# Patient Record
Sex: Female | Born: 1935 | Race: White | Hispanic: No | Smoking: Former smoker
Health system: Southern US, Community
[De-identification: ages and names within clinical notes are randomized; demographics above are authoritative.]

## PROBLEM LIST (undated history)

## (undated) DIAGNOSIS — I2510 Atherosclerotic heart disease of native coronary artery without angina pectoris: Secondary | ICD-10-CM

## (undated) DIAGNOSIS — J449 Chronic obstructive pulmonary disease, unspecified: Secondary | ICD-10-CM

## (undated) DIAGNOSIS — I4510 Unspecified right bundle-branch block: Secondary | ICD-10-CM

## (undated) DIAGNOSIS — E782 Mixed hyperlipidemia: Secondary | ICD-10-CM

## (undated) DIAGNOSIS — I10 Essential (primary) hypertension: Secondary | ICD-10-CM

## (undated) DIAGNOSIS — I739 Peripheral vascular disease, unspecified: Secondary | ICD-10-CM

## (undated) DIAGNOSIS — E039 Hypothyroidism, unspecified: Secondary | ICD-10-CM

## (undated) DIAGNOSIS — Z951 Presence of aortocoronary bypass graft: Secondary | ICD-10-CM

## (undated) HISTORY — PX: CATARACT EXTRACTION: SUR2

## (undated) HISTORY — DX: Mixed hyperlipidemia: E78.2

## (undated) HISTORY — DX: Hypothyroidism, unspecified: E03.9

## (undated) HISTORY — DX: Unspecified right bundle-branch block: I45.10

## (undated) HISTORY — DX: Essential (primary) hypertension: I10

## (undated) HISTORY — DX: Presence of aortocoronary bypass graft: Z95.1

## (undated) HISTORY — PX: EYE SURGERY: SHX253

## (undated) HISTORY — DX: Atherosclerotic heart disease of native coronary artery without angina pectoris: I25.10

## (undated) HISTORY — DX: Peripheral vascular disease, unspecified: I73.9

---

## 2006-07-08 DIAGNOSIS — Z951 Presence of aortocoronary bypass graft: Secondary | ICD-10-CM

## 2006-07-08 HISTORY — DX: Presence of aortocoronary bypass graft: Z95.1

## 2006-09-05 ENCOUNTER — Ambulatory Visit: Payer: Self-pay | Admitting: Cardiology

## 2006-09-06 ENCOUNTER — Ambulatory Visit: Payer: Self-pay | Admitting: Cardiology

## 2006-09-06 ENCOUNTER — Inpatient Hospital Stay (HOSPITAL_COMMUNITY): Admission: EM | Admit: 2006-09-06 | Discharge: 2006-09-12 | Payer: Self-pay | Admitting: Pulmonary Disease

## 2006-09-06 HISTORY — PX: CORONARY ARTERY BYPASS GRAFT: SHX141

## 2006-09-08 ENCOUNTER — Ambulatory Visit: Payer: Self-pay | Admitting: Thoracic Surgery (Cardiothoracic Vascular Surgery)

## 2006-09-18 ENCOUNTER — Encounter: Payer: Self-pay | Admitting: Cardiology

## 2006-09-19 ENCOUNTER — Ambulatory Visit: Payer: Self-pay | Admitting: Cardiology

## 2006-09-30 ENCOUNTER — Ambulatory Visit: Payer: Self-pay | Admitting: Surgery

## 2006-10-09 ENCOUNTER — Ambulatory Visit: Payer: Self-pay | Admitting: Thoracic Surgery (Cardiothoracic Vascular Surgery)

## 2006-10-31 ENCOUNTER — Ambulatory Visit: Payer: Self-pay | Admitting: Thoracic Surgery (Cardiothoracic Vascular Surgery)

## 2006-10-31 ENCOUNTER — Encounter
Admission: RE | Admit: 2006-10-31 | Discharge: 2006-10-31 | Payer: Self-pay | Admitting: Thoracic Surgery (Cardiothoracic Vascular Surgery)

## 2007-01-20 ENCOUNTER — Ambulatory Visit: Payer: Self-pay | Admitting: Cardiology

## 2007-08-25 ENCOUNTER — Ambulatory Visit: Payer: Self-pay | Admitting: Cardiology

## 2007-08-26 ENCOUNTER — Encounter: Payer: Self-pay | Admitting: Physician Assistant

## 2007-09-10 ENCOUNTER — Encounter: Payer: Self-pay | Admitting: Cardiology

## 2007-11-09 ENCOUNTER — Ambulatory Visit: Payer: Self-pay | Admitting: Cardiology

## 2008-06-30 ENCOUNTER — Encounter: Payer: Self-pay | Admitting: Cardiology

## 2008-08-23 ENCOUNTER — Encounter: Payer: Self-pay | Admitting: Cardiology

## 2008-08-23 ENCOUNTER — Ambulatory Visit: Payer: Self-pay | Admitting: Cardiology

## 2008-08-23 DIAGNOSIS — J449 Chronic obstructive pulmonary disease, unspecified: Secondary | ICD-10-CM | POA: Insufficient documentation

## 2008-08-23 DIAGNOSIS — E039 Hypothyroidism, unspecified: Secondary | ICD-10-CM | POA: Insufficient documentation

## 2008-08-23 DIAGNOSIS — E785 Hyperlipidemia, unspecified: Secondary | ICD-10-CM | POA: Insufficient documentation

## 2008-08-23 DIAGNOSIS — I739 Peripheral vascular disease, unspecified: Secondary | ICD-10-CM | POA: Insufficient documentation

## 2008-08-23 DIAGNOSIS — I10 Essential (primary) hypertension: Secondary | ICD-10-CM | POA: Insufficient documentation

## 2008-08-23 DIAGNOSIS — I25810 Atherosclerosis of coronary artery bypass graft(s) without angina pectoris: Secondary | ICD-10-CM | POA: Insufficient documentation

## 2009-03-21 ENCOUNTER — Encounter (INDEPENDENT_AMBULATORY_CARE_PROVIDER_SITE_OTHER): Payer: Self-pay | Admitting: *Deleted

## 2009-03-22 ENCOUNTER — Ambulatory Visit: Payer: Self-pay | Admitting: Cardiology

## 2009-03-23 ENCOUNTER — Encounter: Payer: Self-pay | Admitting: Cardiology

## 2009-03-30 ENCOUNTER — Encounter: Payer: Self-pay | Admitting: Cardiology

## 2009-03-31 ENCOUNTER — Encounter: Payer: Self-pay | Admitting: Cardiology

## 2009-04-21 ENCOUNTER — Encounter: Payer: Self-pay | Admitting: Cardiology

## 2009-06-08 ENCOUNTER — Encounter: Payer: Self-pay | Admitting: Cardiology

## 2009-06-08 ENCOUNTER — Telehealth (INDEPENDENT_AMBULATORY_CARE_PROVIDER_SITE_OTHER): Payer: Self-pay | Admitting: *Deleted

## 2009-06-27 ENCOUNTER — Encounter: Payer: Self-pay | Admitting: Cardiology

## 2009-07-04 ENCOUNTER — Encounter (INDEPENDENT_AMBULATORY_CARE_PROVIDER_SITE_OTHER): Payer: Self-pay | Admitting: *Deleted

## 2010-06-29 ENCOUNTER — Ambulatory Visit: Payer: Self-pay | Admitting: Cardiology

## 2010-06-29 ENCOUNTER — Encounter: Payer: Self-pay | Admitting: Physician Assistant

## 2010-07-03 ENCOUNTER — Encounter: Payer: Self-pay | Admitting: Cardiology

## 2010-07-06 ENCOUNTER — Telehealth (INDEPENDENT_AMBULATORY_CARE_PROVIDER_SITE_OTHER): Payer: Self-pay | Admitting: *Deleted

## 2010-08-09 NOTE — Progress Notes (Signed)
Summary: Needing to discuss thyroid  Phone Note Call from Patient Call back at [facility] Phone [phone]   Caller: Patient Reason for Call: Talk to Nurse Summary of Call: Does not remember conversation about her thyroid.  Would like someone to call her and explain again and explain why she is not being seen sooner.  Initial call taken by: Claudette Laws,  July 06, 2010 12:27 PM  Follow-up for Phone Call        Discussed with patient.  Advised her that I faxed results to PMD on  12/29 at 4:07.  Patient states that nurse told her that they had not received anything from [place].  Patient stated that they scheduled her for OV in March.  Advised her that I would fax results again.  Patient verbalized understanding.  Follow-up by: Hoover Brunette, LPN,  July 06, 2010 2:21 PM

## 2010-08-09 NOTE — Assessment & Plan Note (Signed)
Summary: 1 YR FUL   Visit Type:  Follow-up Primary Provider:  [name], PA (with Dr. [name])   History of Present Illness: [name] presents for annual follow up.  She denies any interim development of symptoms suggestive of UAPat or CHF. However, she has been using an inhaler, for her dyspnea. She has history of COPD, with prior history of tobacco. Interestingly, she was seen in Dr. Adelene Idler office yesterday, and refused to have a recommended CXR.  Last lipid profile, 12/10: LDL 94, on low-dose simvastatin.  Preventive Screening-Counseling & Management  Alcohol-Tobacco     Smoking Status: quit     Year Quit: 2008  Current Medications (verified): 1)  Levothyroxine Sodium 112 Mcg Tabs (Levothyroxine Sodium) .... Once Daily 2)  Aspirin 81 Mg Tbec (Aspirin) .... Take One Tablet By Mouth Daily 3)  Metoprolol Tartrate 50 Mg Tabs (Metoprolol Tartrate) .... Take 1/2 Tablet By Mouth  A Day 4)  Combivent 103-18 Mcg/act Aero (Ipratropium-Albuterol) .... As Needed 5)  Proair Hfa 108 (90 Base) Mcg/act Aers (Albuterol Sulfate) .... As Needed 6)  Lisinopril-Hydrochlorothiazide 20-12.5 Mg Tabs (Lisinopril-Hydrochlorothiazide) .... Take 1 Tablet By Mouth Once A Day 7)  Simvastatin 20 Mg Tabs (Simvastatin) .... Take 1 Tab By Mouth At Bedtime  Allergies: 1)  ! Codeine  Comments:  Nurse/Medical Assistant: The [name]'s medications and allergies were reviewed with the [name] and were updated in the Medication and Allergy Lists. Pt brought medication bottles to office visit.  [name], RN, BSN (June 29, 2010 1:17 PM)  Past History:  Past Medical History: CAD... NSTEMI/ high grade left main disease... three-vessel CABG, 3/08: LIMA-LAD; SCG-OM1; SVG-RCA Normal LV function COPD, severe... question history of asthma HTN Hypothyroidism Dyslipidemia Peripheral vascular disease...50% left RAS RBBB  Social History: Smoking Status:  quit  Review of Systems       No fevers,  chills, hemoptysis, dysphagia, melena, hematocheezia, hematuria, rash, claudication, orthopnea, pnd, pedal edema. Denies claudication. All other systems negative.   Vital Signs:  [name] profile:   77 year old female Height:      62 inches Weight:      126.25 pounds BMI:     23.17 Pulse rate:   57 / minute BP sitting:   111 / 71  (left arm) Cuff size:   regular  Vitals Entered By: [name], RN, BSN (June 29, 2010 1:15 PM) Comments Follow up office visit. No cardiac complaints   Physical Exam  Additional Exam:  GEN: 77 year old female, no distress HEENT: NCAT,PERRLA,EOMI NECK: palpable pulses, no bruits; no JVD; no TM LUNGS: diffuse expiratory wheezes HEART: RRR (S1S2); no significant murmurs; no rubs; no gallops ABD: soft, NT; intact BS EXT: right femoral bruit; palpable PTs; no significant peripheral edema SKIN: warm, dry MUSC: no obvious deformity NEURO: A/O (x3)     EKG  Procedure date:  06/29/2010  Findings:      sinus bradycardia of 58 bpm; chronic RBBB  Impression & Recommendations:  Problem # 1:  CORONARY ATHEROSCLEROSIS, ARTERY BYPASS GRAFT (ICD-414.04)  [name] presents with no interim development of symptoms suggestive of view UAP or DOE, the latter her presenting symptoms prior to undergoing CABG. She has not had a subsequent ischemic evaluation since then, and is reluctant to do so, citing a previous false-negative Cardiolite. Will, therefore, continue to monitor closely, and consider a surveillance stress test, at time of her next OV. Will provide p.r.n. NTG.  Problem # 2:  DYSLIPIDEMIA (ICD-272.4)  reassess lipid status. Target LDL 70 or less, if  feasible. Last level 94, 12/10, on low-dose simvastatin.  Problem # 3:  COPD (ICD-496)  [name] strongly advised to follow with her primary care physician, regarding COPD exacerbation. She presents with active wheezing on examination, and I considered stopping her Toprol, altogether. However, given  pasther history of NSTEMI, I recommend continuing this at the current low dose. She has well-controlled basal heart rate and blood pressure.  Problem # 4:  ESSENTIAL HYPERTENSION, BENIGN (ICD-401.1)  well-controlled on current medication regimen.  Problem # 5:  PVD (ICD-443.9)  [name] denies symptoms of intermittent claudication, but has a right femoral bruit on examination. Also, has previously documented 50% left RAS. We'll continue to monitor clinically, and consider lower extremity ABIs in the future, if she were to develop symptoms. will check baseline labs, for monitoring of renal function and potassium.  Problem # 6:  UNSPECIFIED HYPOTHYROIDISM (ICD-244.9)  we'll check a surveillance TSH. [name] states that she has not had recent labs. if abnormal, will defer to Dr. [name] for future monitoring and management.  Other Orders: EKG w/ Interpretation (93000) T-Comprehensive Metabolic Panel ([address]-60454) T-CBC No Diff (09811-91478) T-Lipid Profile (29562-13086) T-TSH (57846-96295)  [name] Instructions: 1)  Your physician wants you to follow-up in: 1 year. You will receive a reminder letter in the mail one-two months in advance. If you don't receive a letter, please call our office to schedule the follow-up appointment. 2)  Your physician recommends that you go to the [facility] for lab work: Do not eat or drink after midnight.  3)  Your physician recommended you take 1 tablet (or 1 spray) under tongue at onset of chest pain; you may repeat every 5 minutes for up to 3 doses. If 3 or more doses are required, call 911 and proceed to the ER immediately. Prescriptions: NITROSTAT 0.4 MG SUBL (NITROGLYCERIN) 1 tablet under tongue at onset of chest pain; you may repeat every 5 minutes for up to 3 doses.  #25 x 3   Entered by:   [name], RN, BSN   Authorized by:   Lewayne Bunting, MD, [facility]   Signed by:   [name], RN, BSN on 06/29/2010   Method used:   Electronically to         [facility]  [address] [phone]* (retail)       [address]       [place], [place]  [address]       Ph: [phone]       Fax: [phone]   RxID:   [id number]

## 2010-11-20 NOTE — Assessment & Plan Note (Signed)
[facility]                          [name]   STASIA, SOMERO                      MRN:          [id number]  DATE:08/25/2007                            DOB:          1933-06-28    PRIMARY CARDIOLOGIST:  Carla Codding, MD.   REASON FOR VISIT:  Scheduled 6 month followup.   HISTORY:  [name] returns to the clinic since last seen here in July  2008, by Dr. Andee Lineman.  She has multivessel coronary artery disease and  was found to have high-grade left main disease in the setting of a non-  ST elevation myocardial infarction, when we initially saw her here at  [facility] in March 2008.  She underwent successful three-vessel CABG, by  Dr. Dorris Fetch, and has done extremely well from a cardiovascular  standpoint.  Left ventricular function was normal by coronary  angiography.  Additionally, she was found to have a 50% left renal  artery stenosis, but no major aortoiliac obstruction.   The patient has quit smoking tobacco since her bypass surgery.  However,  she does have significant COPD and uses p.r.n. inhalers.  She is also  currently being treated with antibiotics for probable bronchitis, after  being recently seen here in the emergency room.   From a cardiac standpoint, she denies any exertional chest discomfort or  dyspnea.  She is compliant with all of her medications except for the  recent addition of lisinopril/HCTZ, per Dr. Gerhard Munch, for management of  hypertension.  She states that she just took this one time, but then  felt dizzy and has not taken any more of this medication.  However, she  did not check her blood pressure or her pulse at that time.   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  This has left her quite bitter, given that she still feels  quite mentally and physically capable of doing this job.  However, it  does place her in financial constraints with respect to her  prescription medications.   The patient's last lipid profile was in February 2008:  total  cholesterol 209, triglyceride 39, HDL 75 and LDL 126.   The patient does have hypothyroidism and is on supplemental medication.  Her last TSH was 0.08 in February of last year.   CURRENT MEDICATIONS:  1. Levothyroxine 0.112 mg daily.  2. Metoprolol ER 25 mg daily.  3. Coated aspirin 325 mg daily.  4. Simvastatin 40 mg daily q.h.s.  5. Doxycycline 100 mg b.i.d.  6. Hydrocodone p.r.n.   PHYSICAL EXAMINATION:  VITAL SIGNS:  Blood pressure 170/71 initially;  repeat 190/80 left; 185/80 right.  Weight 132.  GENERAL:  A 77 year old female sitting upright in no distress.  HEENT:  Normocephalic, atraumatic.  NECK:  Palpable carotid pulses without bruits; no JVD.  LUNGS:  Diminished breath sounds with faint expiratory wheezes, no  crackles.  HEART:  Regular rate and rhythm (S1 and S2).  No murmurs or rubs.  ABDOMEN:  Soft, nontender.  EXTREMITIES:  Palpable pulses without edema.  NEURO:  No  focal deficits.   IMPRESSION:  1. Multivessel coronary artery disease      a.     High-grade left main/non-ST-segment elevation myocardial       infarction.      b.     Three-vessel coronary artery bypass graft April 2008:  Left       internal mammary artery- left anterior descending; saphenous vein       graft-OM1; saphenous vein graft-right coronary artery.      c.     Normal left ventricular function.  2. Severe chronic obstructive pulmonary disease.      a.     Question history of asthma.      b.     Discontinued tobacco.  3. Uncontrolled hypertension.  4. Treated hypothyroidism.  5. Dyslipidemia.  6. Peripheral vascular disease.      a.     50% left renal artery stenosis.   PLAN:  1. The patient is strongly advised to take the prescribed dose of      lisinopril/HCTZ 20/12.5 mg daily, as prescribed by Dr. Gerhard Munch, for      management of her uncontrolled hypertension.  If she does develop      some  dizziness afterwards, I have instructed her to take her blood      pressure and pulse at home and to notify us if these are, in fact,      low.  I also suggested that she take her ACE inhibitor in the      morning and change her metoprolol ER to evening dosing.  2. Recommend a follow up blood pressure check with our nursing staff      in 2 weeks.  3. Schedule a surveillance fasting lipid profile.  Aggressive lipid      management is recommended with target LDL goal of 70, or less.  4. Down titrate aspirin initially to 162 daily, then 81 mg daily      indefinitely.  5. Schedule return clinic followup with myself and Dr. Andee Lineman in 1      month for reassessment of blood pressure and basal heart rate.  It      may be that she would be better served by being taken off of beta      blocker, particularly with her history of probable asthma and      current active      wheezing.  I will consider this only if her blood pressure is much      improved, following the addition of the lisinopril/HCTZ.      Carla Serpe, PA-C  Electronically Signed      Carla Codding, MD,FACC  Electronically Signed   GS/MedQ  DD: 08/25/2007  DT: 08/26/2007  Job #: [id number]   cc:   Linward Foster

## 2010-11-20 NOTE — Assessment & Plan Note (Signed)
[facility]                          [name]   Carla, Rhodes FREDERICA CHRESTMAN                      MRN:          [id number]  DATE:11/09/2007                            DOB:          [birth year]    REFERRING PHYSICIAN:  Linward Foster   The patient is a 77 year old female with history of coronary artery  disease status post coronary bypass grafting.  The patient has had  difficult to control blood pressure.  Some changes have been recently  made in her medical regimen, particularly her lisinopril had to be  discontinued secondary to cough.  She denies any chest pain.  She does  have dyspnea on exertion, which is chronic, and she has mild audible  wheezing, which is also a chronic finding.  She denies any substernal  chest pain.  She is otherwise doing well from a cardiovascular  perspective.   MEDICATIONS:  1. Levothyroxine.  2. Aspirin 81 mg daily.  3. Diovan 80 mg daily.  4. Metoprolol 50 mg 1/2 tablet p.o. daily.   PHYSICAL EXAMINATION:  VITAL SIGNS:  Blood pressure is 113/71.  Heart  rate is 59 beats per minute.  Weight is 132 pounds  HEENT:  Pupils are equal, round, reactive to light.  Conjunctivae are  pink.  NECK:  Supple.  Normal carotid upstroke.  No carotid bruits.  LUNGS:  Clear breath sounds bilaterally with faint expiratory wheezes.  HEART:  Regular rate and rhythm.  ABDOMEN:  Soft.  EXTREMITY EXAM:  No cyanosis, clubbing or edema.  NEURO:  Patient alert, oriented.  Grossly nonfocal.   PROBLEM LIST:  1. Coronary artery disease status post  coronary bypass grafting.  2. Normal left ventricular function.  3. Chronic obstructive pulmonary disease.  4. Uncontrolled hypertension.  5. Hypothyroidism.  6. Dyslipidemia.  7. Peripheral vascular disease.  50% left renal artery stenosis.   PLAN:  1. The patient is doing well.  She does complain of some dizziness      when standing.  I adjusted her Diovan to 40 mg p.o.  daily.  2. We can continue beta blocker for now, but if the patient continues      to be dizzy, this can be      titrated down particularly in light of her mild wheezing.  3. The patient can follow up with [place] in six months.     Learta Codding, MD,FACC  Electronically Signed    GED/MedQ  DD: 11/09/2007  DT: 11/09/2007  Job #: [id number]   cc:   Linward Foster

## 2010-11-20 NOTE — Assessment & Plan Note (Signed)
[facility]                          [name]   [name], [name] [name]                      MRN:          [id number]  DATE:01/20/2007                            DOB:          Jan 02, 1933    HISTORY OF PRESENT ILLNESS:  The patient is a 77 year old female with a  history of severe left main coronary artery disease.  The patient was  initially seen by Carney Bern in the hospital after the patient had a markedly  positive Cardiolite stress test and she was referred for  catheterization.  She subsequently underwent coronary bypass grafting  x3.  She continues to have preserved LV function.  She has done  remarkably well.  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  She denies any chest pain, shortness  of breath, orthopnea, PND, palpitations or syncope.   MEDICATIONS:  1. Levothyroxine 112 mcg p.o. daily.  2. Singulair 10 mg p.o. nightly.  3. Lipitor 40 mg p.o. daily.  4. Metoprolol succinate ER 25 mg p.o. daily.  5. Enteric-coated aspirin 325 mg daily.  6. Of Rhodes is that the patient also was given Diovan and      hydrochlorothiazide by Dr. Gerhard Munch and the patient states she took      this for 3 days, but now stopped it because she felt she was      drawing up.  I presume that she refers to cramping.   PHYSICAL EXAMINATION:  VITAL SIGNS:  Blood pressure 141/72, heart rate  56.  Weight is 120 pounds.  NECK:  Normal carotid upstroke.  No carotid bruits.  LUNGS:  Clear breath sounds bilaterally.  HEART:  Regular rate and rhythm.  Normal S1 and S2.  No murmurs, rubs,  or gallops.  ABDOMEN:  Soft and nontender.  No rebound or guarding.  Good bowel  sounds.  EXTREMITIES:  No cyanosis, clubbing or edema.  NEUROLOGIC:  The patient is alert and oriented, grossly nonfocal.   PROBLEMS:  1. Coronary artery disease.      a.     Left main coronary artery disease.      b.     Status post coronary artery  bypass grafting x3.      c.     Normal left ventricular function.  2. Diffuse abdominal bruits and bilateral femoral bruits on exam, but      no significant aortoiliac obstruction.  3. Chronic obstructive pulmonary disease and asthma.  4. Long-term tobacco use, discontinued.  5. Treated hypothyroidism.  6. Hypertension.  7. Hyperlipidemia.   PLAN:  1. The patient is overall well from a cardiovascular perspective.  At      this point, she has resumed full-time work.  2. The patient does not want to take Diovan and hydrochlorothiazide      anymore and I told her that we would recheck her potassium and if      it is low, she can certainly resume it and we will put her on      potassium supplements.  3. The patient will follow  up with [place] in the next couple of months.     Learta Codding, MD,FACC  Electronically Signed    GED/MedQ  DD: 01/20/2007  DT: 01/21/2007  Job #: [id number]   cc:   Linward Foster

## 2010-11-23 NOTE — Letter (Signed)
February 04, 2008    Caryl Asp, MD  Medical Director of Occupational Health  [address]  [place], [place] [address]  Fax#:  [phone]   This letter is regarding ability to drive under [employer].   RE:  [name], [name]  MRN:  [id number]  /  DOB:  Oct 19, 1932   Dear Dr Pernell Dupre:   This letter is in regards to [name].  She is a patient of Dr. Andee Lineman  and I am writing this letter in his absence.   The patient has a history of coronary artery disease.  She was initially  evaluated by our practice in early 2008 when she developed unstable  angina pectoris.  A nuclear study was positive and she was transferred  to an outlying hospital for further workup.  She was eventually noted to  have significant three-vessel disease with severe left main disease and  underwent coronary artery bypass grafting surgery.  Her LV function by  cardiac catheterization and nuclear study were both normal.  The patient  has a history of 50% left renal artery stenosis as well as COPD,  hypertension, dyslipidemia, and treated hypothyroidism.  She is a  previous smoker.   She was last seen in the office in May 2009.  Overall, she has done well  since her bypass surgery.  She has not had any significant chest pain or  shortness of breath recently.  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  She has no difficulty with driving  at this time and does not voice any concerns about driving.   According to the [employer], subpart E,  section 391.41 subparagraph (B)(4), the patient does have a history of  angina pectoris and coronary insufficiency.  However, her coronary  insufficiency has been treated with bypass surgery and her angina  pectoris has resolved.  She does not have a history of syncope,  collapse, or congestive heart failure.   The patient, from a cardiovascular standpoint, should be fit to  drive.  If you have further questions, please feel free to contact myself or Dr.  Andee Lineman or one of his partners.    Sincerely,       Tereso Newcomer, PA-C  Electronically Signed      Learta Codding, MD,FACC  Electronically Signed   SW/MedQ  DD: 02/04/2008  DT: 02/05/2008  Job #: [id number]

## 2010-11-23 NOTE — Assessment & Plan Note (Signed)
[facility]                          [name]   Carla, Rhodes                        MRN:          [id number]  DATE:09/19/2006                            DOB:          [birth year]    CARDIOLOGIST:  Learta Codding, MD,FACC.   PRIMARY CARE PHYSICIAN:  Dr.  Linward Foster.   HISTORY OF PRESENT ILLNESS:  [name] is a 77 year old female patient  who we saw initially in consultation at [facility] on September 03, 2006.  She presented with symptoms consistent with unstable angina  pectoris.  Her stress test was abnormal with an EF of 58%, with a area  of moderate to large area of ischemia in the inferior wall and apex,  potentially balanced ischemia and increased TID ratio.  She was  transferred to [facility] for further evaluation.  She  underwent cardiac catheterization by Dr. Charlies Constable on September 08, 2006.  This revealed severe CAD with 95% stenosis of the ostium of the left  main coronary artery, 40% narrowing in the mid-LAD, 40% narrowing in the  proximal circumflex, 50% stenosis in the ostial RCA and 80% stenosis mid-  RCA.  Her LV function was normal at 60%.  She was referred for emergent  bypass surgery.  This was done by Dr. Charlett Lango.  Her grafts  included LIMA to the LAD, vein graft to the obtuse marginal #1, vein  graft to the distal RCA.  She had endoscopic vein harvesting from both  low extremities.  She returns to the office today for followup.  She  overall is doing well.  Her chest is still sore.  She denies significant  shortness of breath.  She denies any orthopnea or paroxysmal nocturnal  dyspnea.  She does note some neck pain.  This is an area for trapezius  on the right.  It is more when she rotates her head from right to left.  She also notes some tingling, especially in the left lower extremity,  around the site of her vein harvesting.  She does quite a bit of  bruising, and this  seems to be improving.   CURRENT MEDICATIONS:  1. Klor-Con 10 mEq daily x7 days total.  2. Levothyroxine 112 mcg daily.  3. Singulair 10 mg daily.  4. Lipitor 40 mg daily.  5. Furosemide 40 mg daily x7 days total.  6. Metoprolol succinate ER 25 mg a day.  7. Aspirin 325 mg daily - She had not yet started this, but I asked      her to go ahead and start this today.  8. Combivent p.r.n.  9. Albuterol p.r.n.   PHYSICAL EXAMINATION:  GENERAL:  She is a well-nourished well-developed  female, no acute distress.  VITAL SIGNS:  Blood pressure is 116/71, pulse 85, weight 112 pounds.  HEENT:  Unremarkable.  NECK:  Without JVD.  Trapezius on the right is somewhat tight, but no  gross abnormalities palpated.  CARDIAC:  Normal S1, S2, regular rate and rhythm without murmurs.  LUNGS:  Clear to auscultation bilaterally with decreased breath sounds.  EXTREMITIES:  Without edema.  SKIN:  Chest incision and bilateral lower extremity incisions all  healing well.  She does have a great amount of ecchymosis, with the left  being greater than the right.   Electrocardiogram revealed a sinus rhythm with a heart rate of 81,  normal axis, right bundle branch block, no acute changes.   IMPRESSION:  1. Coronary artery disease.      a.     Transfer from [facility] with unstable angina       pectoris and positive nuclear study, with cardiac catheterization       notable for severe left main disease.      b.     Status post coronary artery bypass graft x3 with grafts as       noted above.  2. Good left ventricular function.  3. Diffuse abdominal  bruits and bilateral femoral artery bruits on      exam.      a.     At catheterization, she was noted to have 50% left renal       artery stenosis and no major aorto-iliac obstruction.  4. Chronic obstructive pulmonary disease/asthma.  5. Longstanding tobacco abuse.  6. Treated hypothyroidism.  7. Hypertension.  8. Hyperlipidemia.   FAMILY HISTORY:   Coronary artery disease.   PLAN:  The patient presents to the office today for post-hospitalization  followup.  She is post-bypass surgery.  Overall, she is doing well.  She  does have some discomfort in her legs, where she had her veins  harvested.  I reassured her about this today and explained to her that  this should improve over time.  Her neck is also uncomfortable for her.  She seems to have a trapezius strain.  I have asked her to use Tylenol  and heat for this, and this should also improve over time.  She is to  increase her activity slowly.  She is to continue the medications as  listed above.  She sees Dr. Dorris Rhodes later this month.  Her chest x-  ray was done yesterday.  This showed surgical changes but no evidence of  acute cardiopulmonary disease.  Her lab work from  yesterday also revealed creatinine of 1.0, normal LFTs, potassium of  3.5, hemoglobin 11.8.  She will follow up in this office in the next 4  to 6 weeks.      Carla Newcomer, PA-C  Electronically Signed      Learta Codding, MD,FACC  Electronically Signed   SW/MedQ  DD: 09/19/2006  DT: 09/20/2006  Job #: [id number]   cc:   [name]. [name], M.D.  Linward Foster

## 2010-11-23 NOTE — Cardiovascular Report (Signed)
[name], [name]               ACCOUNT NO.:  [id number]   MEDICAL RECORD NO.:  [id number]          PATIENT TYPE:  INP   LOCATION:  2924                         FACILITY:  [facility]   PHYSICIAN:  Everardo Beals. Juanda Chance, MD, FACCDATE OF BIRTH:  May 28, 1933   DATE OF PROCEDURE:  09/08/2006  DATE OF DISCHARGE:                            CARDIAC CATHETERIZATION   PRIMARY CARE PHYSICIANS:  1. [place] Linward [place]   CLINICAL HISTORY:  [name] is 77 years old and has no prior known  heart disease.  She is a chronic smoker and has chronic obstructive  pulmonary disease and known peripheral vascular disease.  She recently  had an abnormal Myoview scan and then developed recurrent chest pain and  was admitted to [facility] by Gene Serpe, PA-C, and Jonelle Sidle, MD, and transferred to [facility] for  further evaluation.  Her enzymes returned positive for a non-ST-  elevation myocardial infarction.   PROCEDURE:  The procedure was performed by the right femoral arteries  and arterial sheath and 6-French preformed coronary catheters.  A front  wall arterial puncture was performed and Omnipaque contrast was used.  We used a JR-4 guiding catheter with side holes for injection of the  right coronary due to damping.  The left coronary angiograms were done  with subselective injections due to the presence of an ostial left main  disease.  Distal aortogram was performed to evaluate the patient for  possible intra-aortic balloon pumping.  The patient tolerated the  procedure well and left the laboratory in satisfactory condition.   RESULTS:  Left main coronary artery:  The left main coronary had a 95%  proximal stenosis extending to the ostium with moderate calcification.   Left anterior descending artery:  The left anterior descending artery  gave rise to two septal perforators and diagonal branch.  This vessel  was irregular and there was 40%  narrowing in the mid portion of the  vessel.   Circumflex artery:  The circumflex artery gave rise to a small marginal  branch, an atrial branch and a posterolateral branch.  There was 40 cm  proximal circumflex artery.   Right coronary artery:  The right coronary artery was a small vessel  that gave rise to a right ventricle branch and posterior descending  branch.  There was 50% ostial stenosis.  There was 70-80% stenosis in  the mid vessel.   Left ventriculogram:  The left ventriculogram performed in the RAO  projection showed good wall motion with no areas of hypokinesis.  Estimated fraction was 60%.   Distal aortogram:  A distal aortogram was performed which showed patent  50% left renal artery stenosis.  There was no major aortoiliac  obstruction.   The aortic pressure was 151/75 with a mean of 103 ad the left ventricle  pressure was 151/11.   CONCLUSION:  Severe coronary artery disease with 95% stenosis of the  ostium of the left main coronary artery, 40% narrowing in the mid left  anterior descending artery, 40% narrowing in the proximal circumflex  artery,  50% stenosis in the ostium of the right coronary artery and 70-  80% stenosis in the mid-right coronary and normal left ventricular  function.   RECOMMENDATIONS:  The patient has a tight left main disease and a recent  non-ST-elevation myocardial infarction.  She has chronic obstructive  lung disease and is a smoker but her cardiac risk from a pulmonary  standpoint does not appear to be prohibitive.  I think bypass surgery is  indicated and Dr. Dorris Fetch has been consulted.      Bruce Elvera Lennox Juanda Chance, MD, [facility]  Electronically Signed     BRB/MEDQ  D:  09/08/2006  T:  09/08/2006  Job:  [id number]   cc:   Christa See, MD  Cardiopulmonary Lab

## 2010-11-23 NOTE — Op Note (Signed)
NAMEJASON, HAUGE NO.:  [id number]   MEDICAL RECORD NO.:  [id number]          PATIENT TYPE:  INP   LOCATION:  2304                         FACILITY:  [facility]   PHYSICIAN:  Salvatore Decent. Dorris Fetch, M.D.DATE OF BIRTH:  1933/03/24   DATE OF PROCEDURE:  09/08/2006  DATE OF DISCHARGE:                               OPERATIVE REPORT   PREOPERATIVE DIAGNOSIS:  Critical left main disease, status post non-Q-  wave myocardial infarction.   POSTOPERATIVE DIAGNOSIS:  Critical left main disease, status post non-Q-  wave myocardial infarction.   PROCEDURE:  Median sternotomy; extracorporeal circulation; coronary  bypass grafting x3 (left internal mammary artery to left anterior  descending artery, saphenous vein graft obtuse marginal #1, saphenous  vein graft distal right coronary); endoscopic vein harvest, both thighs.   SURGEON:  Salvatore Decent. Dorris Fetch, M.D.   ASSISTANT:  Rowe Clack, P.A.-C.   ANESTHESIA:  General.   FINDINGS:  Sternal osteoporosis, severe emphysema, small but otherwise  good-quality coronaries.  Veins small, satisfactory.  Mammary, good-  quality.   CLINICAL NOTE:  This Zody is a 77 year old with a history of heavy  tobacco abuse and COPD.  She has been having new-onset exertional chest  discomfort.  She was admitted with a possible COPD flare but while in  the hospital ruled in for a myocardial infarction.  Today she underwent  cardiac catheterization, where she was found to have a 95% left main and  approximately 70% right coronary stenosis.  She had preserved left  ventricular function.  She was advised to undergo urgent coronary bypass  grafting.  The indications, risks, benefits and alternatives were  discussed in detail with the patient.  She understood and accepted the  risks and agreed to proceed.   OPERATIVE NOTE:  Ms. Dunigan was brought to the preop holding area on  September 08, 2006.  There the anesthesia service placed lines for  monitoring  arterial, central venous and pulmonary arterial pressure.  Intravenous  antibiotics were administered.  She was taken to the operating room,  anesthetized and intubated.  A Foley catheter was placed.  The chest,  abdomen and legs were prepped and draped in the usual fashion.  Incision  was made in the medial aspect of the right leg at the level of the knee.  The greater saphenous vein was identified.  It was relatively small but  was harvested using standard endoscopic technique.  Simultaneously a  median sternotomy was performed and the left internal mammary artery was  harvested using standard technique.  There was marked emphysema with  hyperinflation of the lungs and significant sternal osteoporosis.  Five  thousand units of heparin was administered during the vessel harvest.  After removing the vein from the leg and inspecting it, there was a  segment that was too small to be used as a bypass graft.  Therefore  there was insufficient vein for both venous grafts.  An incision was  made in the medial aspect of the left leg again just below the knee and  the greater saphenous vein was harvested from the knee to the  mid thigh  on the left.  The saphenous vein from the left was slightly larger than  from the right.  Both were only satisfactory in quality.   The pericardium was opened.  The ascending aorta was inspected.  There  was no evidence of atherosclerotic disease.  The aorta was cannulated  via concentric 2-0 Ethibond pledgeted pursestring sutures after giving  the remainder of the full heparin dose and confirming adequate  anticoagulation with ACT measurement.  A dual-stage venous cannula was  placed via pursestring suture in the right atrial appendage.  Cardiopulmonary bypass was instituted and flows were maintained per  protocol throughout the procedure.  The coronary arteries were inspected  and anastomotic sites were chosen.  The conduits were inspected and cut   to length.  A foam pad was placed in the pericardium to protect the left  main nerve.  A temperature probe was placed in the myocardial septum and  a cardioplegic cannula was placed in the ascending aorta.   The aorta was crossclamped.  The left ventricle was emptied via aortic  root vent.  Cardiac arrest then was achieved with a combination of cold  antegrade blood cardioplegia and topical iced saline.  After achieving a  complete diastolic arrest and adequate myocardial septal cooling to less  than 10 degrees Celsius, the following distal anastomoses were  performed.   First a reversed saphenous vein graft was placed end-to-side to the  distal right coronary.  This was a very small vessel for a distal right  coronary.  It was only 1.5 mm in diameter.  The vein graft was  relatively small but of satisfactory quality.  The anastomosis was  performed with a running 7-0 Prolene suture.  A 1.5 mm probe passed  easily proximally and distally at the completion of the anastomosis.  Cardioplegia was administered.  There was good flow and good hemostasis.   Next a reversed saphenous vein graft was placed end-to-side to the first  obtuse marginal branch of the circumflex.  This was a single large  dominant lateral branch.  It was 1.5 mm diameter and was of good quality  at the site of the anastomosis.  The vein was anastomosed end-to-side  with a running 7-0 Prolene suture.  Again a probe passed easily.  Cardioplegia was administered with good flow and good hemostasis.   Next the left internal mammary artery was brought through a window in  the pericardium.  The distal end was beveled and was anastomosed end-to-  side to the LAD.  The LAD was a 1.5-mm good-quality target.  The  anastomosis was performed end-to-side with a running 8-0 Prolene suture.  After completion of the mammary to LAD anastomosis, the bulldog clamp was briefly removed to inspect for hemostasis.  Immediate and rapid  septal  rewarming was noted.  The bulldog clamp was replaced.  Additional  cardioplegia was administered down the vein grafts and aortic root.   The vein grafts were cut to length.  The cardioplegia cannula was  removed from the ascending aorta.  Proximal vein graft anastomoses were  performed under crossclamp to 4.0 mm punch aortotomies with running 6-0  Prolene sutures.  At the completion of the final proximal anastomosis,  the patient was placed in Trendelenburg position.  Lidocaine was  administered.  The bulldog clamp was again removed from left mammary  artery, again with immediate and rapid septal rewarming.  The aortic  root was de-aired and the aortic crossclamp was removed.  The total  crossclamp time was 55 minutes.   While the patient was being rewarmed, all proximal and distal  anastomoses were inspected for hemostasis.  The patient converted to  sinus rhythm spontaneously and did not require defibrillation.  Epicardial pacing wires were placed on the right ventricle and right  atrium.  When she had rewarmed to 37 degrees Celsius, she was weaned  from cardiopulmonary bypass on the first attempt without difficulty.  The total bypass time was 81 minutes.  The initial cardiac index was  greater than 2 L/min. per sq. m.   A test dose protamine was administered and was well-tolerated.  the  atrial and aortic cannulae were removed.  The remainder of the protamine  was administered without incident.  The chest was irrigated with 1 L of  warm normal saline containing 1 g of vancomycin.  Hemostasis was  achieved.  A left pleural and two mediastinal chest tubes were placed  through separate subcostal incisions.  The pericardium was  reapproximated with interrupted 3-0 silk sutures.  it came together  easily without tension.  The sternum was closed with heavy-gauge  stainless steel wires.  following closure of the sternum, the patient  had a drop in her cardiac output.  this was not  accompanied by any  significant hemodynamic changes and was likely related to increased  pressure within the chest impairing venous filling due to her markedly  hyperinflated and emphysematous lungs.  A low-dose dopamine infusion was  initiated.  The patient remained stable thereafter.  The remainder of  the incision was closed in standard fashion.  All sponge, needle and  sponge counts were correct at the end of the procedure.  The patient was  transported from the operating room to the surgical intensive care unit  in critical but stable condition.           ______________________________  Salvatore Decent Dorris Fetch, M.D.     SCH/MEDQ  D:  09/08/2006  T:  09/09/2006  Job:  [id number]   cc:   Jonelle Sidle, MD  Linward Foster

## 2010-11-23 NOTE — Consult Note (Signed)
[name], [name] NO.:  [id number]   MEDICAL RECORD NO.:  [id number]          PATIENT TYPE:  INP   LOCATION:  2807                         FACILITY:  [facility]   PHYSICIAN:  Salvatore Decent. Dorris Fetch, M.D.DATE OF BIRTH:  1933-04-13   DATE OF CONSULTATION:  09/08/2006  DATE OF DISCHARGE:                                 CONSULTATION   REASON FOR CONSULTATION:  A 95% left main stenosis.   HISTORY OF PRESENT ILLNESS:  [name] is a 77 year old female with no  prior cardiac history who recently has been having exertional chest  pain, this has been occurring for about the last month, it is a  bilateral upper arm heaviness and pressure in the midsternal region, she  does have dyspnea and diaphoresis, but no nausea.  She had a stress  Cardiolite which was abnormal and was referred to Dr. Nona Dell.  She was admitted at the [facility] with chest pain.  Serial EKs  were abnormal, but at that time she ruled out.  She was treated for  possible COPD exacerbation with steroids and Levaquin. The Levaquin  subsequently was discontinued.  Today, she underwent cardiac  catheterization which revealed severe three-vessel and left main  coronary disease, she had a 95% left main stenosis and a 70% stenosis in  the right coronary, her ejection fraction was normal.  She currently is  pain free.  She did not rule in for myocardial infarction while in the  hospital with a troponin of 17.4 last night.  Again, the patient  currently is pain free.   PAST MEDICAL HISTORY:  Significant for:  1. COPD secondary to heavy tobacco abuse.  2. Hypothyroidism.  3. Hypertension.  4. Hyperlipidemia.   PAST SURGICAL HISTORY:  None.   MEDICATIONS PRIOR TO ADMISSION:  1. Avalide/hydrochlorothiazide 150/12.5 one daily.  2. Levothyroxine 112 mcg p.o. daily.  3. Toprol ER 25 mg daily.  4. Combivent inhaler two puffs t.i.d. p.r.n.   SHE HAS AN INTOLERANCE TO CODEINE.   FAMILY HISTORY:   Significant for CAD.   SOCIAL HISTORY:  She is single, she drives a tractor trailer, she does  smoke at least one pack a day since she was 62.   REVIEW OF SYSTEMS:  No pertinent issues.   PHYSICAL EXAMINATION:  GENERAL:  [name] is a 77 year old woman in no  acute distress.  VITALS:  Blood pressure 118/62, pulse 57, respirations are 20.  NEUROLOGICALLY:  She is alert and oriented x3.  She is appropriate and  grossly intact.  HEENT EXAM:  Unremarkable.  NECK:  Supple without thyromegaly, adenopathy or bruits.  LUNGS:  Clear, but with diminished breath sounds.  CARDIAC EXAM:  Regular rate and rhythm, normal S1-S2, no rubs, murmurs  or gallops.  ABDOMEN:  Soft and nontender.  EXTREMITIES:  She does have palpable 1+ distal pulses in both lower  extremities.  There is no peripheral edema.  SKIN:  Warm and dry.   LABORATORY DATA:  CK 391, MB 51, troponin 17, white count 11.7,  hematocrit 39, platelets 219, sodium 133, potassium 3.6, chloride 97,  CO2 28, BUN and creatinine 29 and 1.0, glucose 116, PT 13.4 with an INR  of 1.0, albumin 3.9.   IMPRESSION:  [name] is a 77 year old smoker with critical left main  disease, she has a 95% stenosis following in the hospital, non-Q-wave  MI. Urgent coronary artery bypass grafting is indicated for survival  benefit as well as relief of symptoms.  I have discussed in detail with  the patient and her family members the indications, risks and  alternatives.  She understands the nature of the procedure and extent of  the operation including the incisions to be used, need for general  anesthesia, expected postoperative stay and overall recovery.  She  understands the risks include, but are not limited to death, stroke,  myocardial infraction, deep vein thrombosis, pulmonary embolism,  bleeding, possible need for transfusions, infections as well as other  organ system dysfunction including respiratory, renal or GI  complications, she understands  and excepts these risks and agrees to  proceed.  The OR is in preparation and we will proceed as soon as  possible.           ______________________________  Salvatore Decent. Dorris Fetch, M.D.     SCH/MEDQ  D:  09/08/2006  T:  09/08/2006  Job:  [id number]   cc:   Jen Mow. Juanda Chance, MD, [facility]

## 2010-11-23 NOTE — Discharge Summary (Signed)
[name], [name] NO.:  [id number]   MEDICAL RECORD NO.:  [id number]          PATIENT TYPE:  INP   LOCATION:  2016                         FACILITY:  [facility]   PHYSICIAN:  Theda Belfast, PA DATE OF BIRTH:  [birth year]   DATE OF ADMISSION:  09/06/2006  DATE OF DISCHARGE:  09/12/2006                               DISCHARGE SUMMARY   PRIMARY DIAGNOSIS:  Critical left main disease status post non-Q-wave  myocardial infarction.   IN HOSPITAL DIAGNOSES:  1. Volume overload postoperatively.  2. Postop thrombocytopenia.  3. Acute blood loss anemia postoperatively.   SECONDARY DIAGNOSES:  1. Chronic obstructive pulmonary disease secondary to her tobacco      abuse.  2. Hypothyroidism.  3. Hypertension.  4. Hyperlipidemia.   IN HOSPITAL OPERATIONS AND PROCEDURES:  1. Cardiac catheterization.  2. Emergent coronary artery bypass grafting x3 using a left internal      mammary artery to left anterior descending artery, saphenous vein      graft to obtuse marginal number 1, saphenous vein graft to this      right coronary artery.  Endoscopic vein harvest from bilateral      thighs.   PATIENT'S HISTORY AND PHYSICAL AND HOSPITAL COURSE:  The patient is a 77-  year-old female with history of heavy tobacco abuse and COPD.  She has  been having new onset exertional chest discomfort.  She was admitted as  a possible COPD flare but while in the hospital ruled in for myocardial  infarction.  She underwent cardiac catheterization on September 08, 2006  where she was found to have a 95% left main and approximately 70% right  coronary stenosis.  She had preserved left ventricular function.  At  that time Dr. Dorris Fetch was called.  Dr. Dorris Fetch saw and evaluated  the patient.  He discussed the patient undergoing emergent coronary  artery bypass grafting.  The risks and benefits were discussed with the  patient.  The patient acknowledged her understanding and agreed to  proceed.  For details of the patient's past medical history and physical  exam please see dictated H&P.   The patient was taken emergently to the operating room on September 08, 2006  where she underwent coronary artery bypass grafting x3.  Using a left  internal mammary artery to left anterior descending artery, saphenous  vein graft to obtuse marginal number 1, saphenous vein graft to distal  right coronary artery.  The patient tolerated this procedure well and  was transferred to the intensive care unit in stable condition.  Postoperatively the patient was noted to be hemodynamically stable.  She  was extubated on the evening of surgery.  The remainder of patient's  postoperative course was pretty much unremarkable.  Postop day 1,  hemoglobin and hematocrit were stable at 12 and 35%.  Vital signs noted  to be stable.  Chest x-ray stable with minimal chest tube drainage.  The  patient's chest tubes were discontinued postop day 1.  Postop day 2 the  patient was out of bed ambulating well with assistance.  Platelet count  dropped to  75.  Hemoglobin/hematocrit decreased to 9.8 and 29%.  The  patient was asymptomatic.  Due to the patient's history of COPD and use  of inhalers, she was started on low dose steroid taper.  Her pulmonary  status remained stable prior to discharge.  The patient was transferred  out to 2000 on postop day 2.  She did develop volume overload requiring  several doses of diuretics.  This was back near baseline prior to  discharge.  Patient's hemoglobin and hematocrit remained stable prior to  discharge.  Platelet count started to increase.  The patient was out of  bed ambulating well.  She was tolerating diet well.  She remained in  normal sinus rhythm postoperatively.  As stated above, pulmonary status  was stable with use of incentive spirometers, inhalers and steroid  taper.  The patient was able to be weaned off oxygen, satting greater  than 90% on room air.  The  patient's incisions were clean, dry and  intact and healing well.   The patient was discharged to home postop day 4, September 12, 2006.   FOLLOW-UP APPOINTMENTS:  Follow-up appointment will be scheduled with  Dr. Dorris Fetch in 3 weeks.  Our office will contact the patient with  this information.  The patient will need to follow up with Dr. Juanda Chance in  2 weeks.  She will need to contact Dr. Regino Schultze office to schedule this  appointment.   ACTIVITY:  Patient instructed no driving to __________ to do so, no  heavy lifting over 10 pounds.  The patient was told to ambulate 3-4  times per day, progress as tolerated and continue her breathing  exercises.   INCISIONAL CARE:  The patient was told she can shower, washing her  incisions using soap and water.  She is to contact the office if she  develops any drainage or opening from any of her incision sites.   DIET:  The patient educated on diet to be low-fat, low-salt as well as  carbohydrate modified medium calorie diet.   DISCHARGE MEDICATIONS:  1. Aspirin 325 mg daily.  2. Toprol XL 25 mg daily.  3. Lipitor 40 mg daily.  4. Synthroid 112 mcg daily.  5. Singulair 10 mg at night.  6. Combivent inhaler 2 puffs q.i.d.  7. Lasix 40 mg daily x7 days.  8. Potassium chloride 20 mEq daily x7 days.  9. Ultram 50 mg 1-2 tablets q.4-6hours p.r.n. pain.      Theda Belfast, PA     KMD/MEDQ  D:  10/21/2006  T:  10/21/2006  Job:  [phone]   cc:   Salvatore Decent. Dorris Fetch, M.D.  Bruce Elvera Lennox Juanda Chance, MD, [facility]

## 2011-01-01 ENCOUNTER — Encounter: Payer: Self-pay | Admitting: Physician Assistant

## 2011-05-02 ENCOUNTER — Telehealth: Payer: Self-pay | Admitting: *Deleted

## 2011-05-02 NOTE — Telephone Encounter (Signed)
Received call from [facility] - Dr. Lyndal Rainbow.   Want to know when patient needs to stop blood thinner.   Returned call & spoke with Andrey Campanile - advised her that patient only on Aspirin & no need to hold if not extracting more than 2-3 teeth at a time.  Will fax info given by GD on "Surgical Management of the Primary Care Dental Patient on Antiplatelet Medication".    Faxed to:  [phone] Phone:  [phone]  Patient notified of this info yesterday & verbalized understanding.

## 2011-05-08 NOTE — Telephone Encounter (Signed)
Agree. thanks

## 2011-05-08 NOTE — Telephone Encounter (Signed)
Agree thank you 

## 2011-07-11 ENCOUNTER — Encounter: Payer: Self-pay | Admitting: Cardiology

## 2011-07-11 ENCOUNTER — Ambulatory Visit (INDEPENDENT_AMBULATORY_CARE_PROVIDER_SITE_OTHER): Payer: Medicare Other | Admitting: Cardiology

## 2011-07-11 VITALS — BP 151/77 | HR 65 | Ht 62.0 in | Wt 119.0 lb

## 2011-07-11 DIAGNOSIS — Z951 Presence of aortocoronary bypass graft: Secondary | ICD-10-CM

## 2011-07-11 DIAGNOSIS — I2510 Atherosclerotic heart disease of native coronary artery without angina pectoris: Secondary | ICD-10-CM

## 2011-07-11 NOTE — Patient Instructions (Signed)
Your physician you to follow up in 1 year. You will receive a reminder letter in the mail one-two months in advance. If you don't receive a letter, please call our office to schedule the follow-up appointment. Your physician recommends that you continue on your current medications as directed. Please refer to the Current Medication list given to you today. 

## 2011-07-21 ENCOUNTER — Encounter: Payer: Self-pay | Admitting: Cardiology

## 2011-07-21 NOTE — Progress Notes (Signed)
Carla Bottoms, MD, [facility] Board Certified in Adult Cardiovascular Medicine,Internal Medicine and Critical Care Medicine    CC: followup patient with a history of coronary artery disease  HPI:  The patient is a 78year old female with history of coronary artery disease, status post coronary bypass grafting and hypertension.  She has been doing well from a cardiovascular perspective.  She denies any chest pain, shortness of breath, orthopnea or PND.  She reports no palpitations or syncope.  She has COPD, which is stable.  Blood pressure is under reasonable control.  She does have known 50% left renal artery stenosis, but presumed normal renal function.   PMH: reviewed and listed in Problem List in Electronic Records (and see below) Past Medical History  Diagnosis Date  . Coronary artery disease     NSTEMI/high grade left main disease  . COPD, severe     Question history of asthma  . Hypertension   . Hypothyroidism   . Dyslipidemia   . Peripheral vascular disease     50% left RAS  . RBBB (right bundle branch block)   . Status post coronary artery bypass grafting     status post cabbage 2008   Past Surgical History  Procedure Date  . Coronary artery bypass graft 3/08    Three-vessel: LIMA-LAD; SCG-OM1; SVG-RCA; normal LV function    Allergies/SH/FHX : available in Electronic Records for review  Allergies  Allergen Reactions  . Codeine     REACTION: rashes swelling   History   Social History  . Marital Status: Divorced    Spouse Name: N/A    Number of Children: N/A  . Years of Education: N/A   Occupational History  . Not on file.   Social History Main Topics  . Smoking status: Former Smoker -- 0.8 packs/day for 55 years    Types: Cigarettes    Quit date: 07/08/2006  . Smokeless tobacco: Never Used  . Alcohol Use: No  . Drug Use: Not on file  . Sexually Active: Not on file   Other Topics Concern  . Not on file   Social History Narrative  . No narrative on  file   No family history on file.  Medications: Current Outpatient Prescriptions  Medication Sig Dispense Refill  . albuterol-ipratropium (COMBIVENT) 18-103 MCG/ACT inhaler Inhale into the lungs as needed.        Marland Kitchen aspirin 81 MG EC tablet Take 81 mg by mouth daily.        Marland Kitchen levothyroxine (SYNTHROID, LEVOTHROID) 112 MCG tablet Take 112 mcg by mouth daily.        Marland Kitchen lisinopril-hydrochlorothiazide (PRINZIDE,ZESTORETIC) 20-12.5 MG per tablet Take 1 tablet by mouth daily.        . metoprolol (LOPRESSOR) 50 MG tablet Take 25 mg by mouth daily.        . nitroGLYCERIN (NITROSTAT) 0.4 MG SL tablet Place 0.4 mg under the tongue every 5 (five) minutes as needed. May repeat up to 3 doses.       . simvastatin (ZOCOR) 20 MG tablet Take 20 mg by mouth at bedtime.          ROS: No nausea or vomiting. No fever or chills.No melena or hematochezia.No bleeding.No claudication  Physical Exam: BP 151/77  Pulse 65  Ht 5\' 2"  (1.575 m)  Wt 119 lb (53.978 kg)  BMI 21.77 kg/m2 General:well-nourished white female in no distress Neck:normal carotid upstroke and no carotid bruits.  No thyromegaly no nodular thyroid.  JVP is 5 cm. Lungs:diminished breath sounds bilaterally but no wheezing Cardiac:regular rate and rhythm with normal S1, S2.  No murmur rubs or gallops Vascular:no edema.  Normal distal pulses Skin:warm and dry Physcologic:normal affect  12lead AVW:UJWJXB sinus rhythm, with no prior infarct pattern or acute changes. Limited bedside ECHO:N/A   Patient Active Problem List  Diagnoses  . UNSPECIFIED HYPOTHYROIDISM  . DYSLIPIDEMIA  . ESSENTIAL HYPERTENSION, BENIGN  . CORONARY ATHEROSCLEROSIS, ARTERY BYPASS GRAFT  . PVD  . COPD  . Status post coronary artery bypass grafting    PLAN   Patient is stable from a cardiovascular perspective.  She had bypass surgery 2008, but reports no chest pain.  Blood pressure slightly elevated but the patient states that her blood pressure normal at home.   Continue current medical regimen.  Lipid panel and cholesterol management per primary care physician.  I have made no change in the patient's medical regimen.  She will followup with me in one year.

## 2012-07-10 ENCOUNTER — Ambulatory Visit: Payer: Medicare Other | Admitting: Cardiology

## 2012-07-10 DIAGNOSIS — Z951 Presence of aortocoronary bypass graft: Secondary | ICD-10-CM | POA: Insufficient documentation

## 2013-01-29 ENCOUNTER — Encounter (INDEPENDENT_AMBULATORY_CARE_PROVIDER_SITE_OTHER): Payer: Medicare Other | Admitting: Ophthalmology

## 2013-01-29 DIAGNOSIS — H3530 Unspecified macular degeneration: Secondary | ICD-10-CM

## 2013-01-29 DIAGNOSIS — H35039 Hypertensive retinopathy, unspecified eye: Secondary | ICD-10-CM

## 2013-01-29 DIAGNOSIS — H43819 Vitreous degeneration, unspecified eye: Secondary | ICD-10-CM

## 2013-01-29 DIAGNOSIS — I10 Essential (primary) hypertension: Secondary | ICD-10-CM

## 2013-04-29 ENCOUNTER — Encounter: Payer: Self-pay | Admitting: Cardiology

## 2013-07-20 ENCOUNTER — Ambulatory Visit (INDEPENDENT_AMBULATORY_CARE_PROVIDER_SITE_OTHER): Payer: Medicare Other | Admitting: Cardiology

## 2013-07-20 ENCOUNTER — Encounter: Payer: Self-pay | Admitting: Cardiology

## 2013-07-20 VITALS — BP 117/72 | HR 51 | Ht 62.0 in | Wt 124.0 lb

## 2013-07-20 DIAGNOSIS — R001 Bradycardia, unspecified: Secondary | ICD-10-CM

## 2013-07-20 DIAGNOSIS — E785 Hyperlipidemia, unspecified: Secondary | ICD-10-CM

## 2013-07-20 DIAGNOSIS — I10 Essential (primary) hypertension: Secondary | ICD-10-CM

## 2013-07-20 DIAGNOSIS — I498 Other specified cardiac arrhythmias: Secondary | ICD-10-CM

## 2013-07-20 DIAGNOSIS — I25810 Atherosclerosis of coronary artery bypass graft(s) without angina pectoris: Secondary | ICD-10-CM

## 2013-07-20 MED ORDER — METOPROLOL TARTRATE 12.5 MG HALF TABLET: 12.5000 mg | ORAL_TABLET | Freq: Two times a day (BID) | ORAL | Status: DC

## 2013-07-20 NOTE — Patient Instructions (Addendum)
Your physician recommends that you schedule a follow-up appointment in: 1 year with Dr. Harl Bowie. You should receive a letter in the mail in 10 months. If you do not receive this letter by November 2015 call our office to schedule this appointment.   Your physician has recommended you make the following change in your medication:  Decrease Metoprolol 12.5 MG twice daily. (1/2 tablet by mouth twice daily)  Continue all other medications the same.   Your physician recommends that you return for lab work in: Tomorrow for Fasting (Nothing to eat or drink after midnight) Lipid, bmet, cbc, and tsh.  You can go to the following locations to get lab work done: [employer] [address] Dr. Edrick Oh office in [facility].

## 2013-07-20 NOTE — Progress Notes (Signed)
Clinical Summary [name] is a 80 y.o.female former patient of Dr Dannielle Burn, this is our first visit together. She is seen for the following medical problems.  1. CAD - prior 3 vessel  CABG in 2008 as described below at [facility] - no recent chest pain. Reports SOB at times related to COPD which is episodic. Walks regularly around track when weather is nice 2 miles without troubles. - no orthopnea, no PND, no LE edema - compliant with meds  2. Renal artery stenosis - described as 50% from prior notes - no history of renal dysfunction, though she has not gone to get her labs drawn in some time.   3. HTN - doesn't check at home - compliant with meds  4. Hyperlipidemia - compliant with zocor - no recent lipid panel  Past Medical History  Diagnosis Date  . Coronary artery disease     NSTEMI/high grade left main disease  . COPD, severe     Question history of asthma  . Hypertension   . Hypothyroidism   . Dyslipidemia   . Peripheral vascular disease     50% left RAS  . RBBB (right bundle [name] block)   . Status post coronary artery bypass grafting     status post cabbage 2008     Allergies  Allergen Reactions  . Codeine     REACTION: rashes swelling     Current Outpatient Prescriptions  Medication Sig Dispense Refill  . albuterol-ipratropium (COMBIVENT) 18-103 MCG/ACT inhaler Inhale into the lungs as needed.        Marland Kitchen aspirin 81 MG EC tablet Take 81 mg by mouth daily.        Marland Kitchen levothyroxine (SYNTHROID, LEVOTHROID) 112 MCG tablet Take 112 mcg by mouth daily.        Marland Kitchen lisinopril-hydrochlorothiazide (PRINZIDE,ZESTORETIC) 20-12.5 MG per tablet Take 1 tablet by mouth daily.        . metoprolol (LOPRESSOR) 50 MG tablet Take 25 mg by mouth daily.        . nitroGLYCERIN (NITROSTAT) 0.4 MG SL tablet Place 0.4 mg under the tongue every 5 (five) minutes as needed. May repeat up to 3 doses.       . simvastatin (ZOCOR) 20 MG tablet Take 20 mg by mouth at bedtime.         No  current facility-administered medications for this visit.     Past Surgical History  Procedure Laterality Date  . Coronary artery bypass graft  3/08    Three-vessel: LIMA-LAD; SCG-OM1; SVG-RCA; normal LV function     Allergies  Allergen Reactions  . Codeine     REACTION: rashes swelling      No family history on file.   Social History [name] reports that she quit smoking about 7 years ago. Her smoking use included Cigarettes. She has a 44 pack-year smoking history. She has never used smokeless tobacco. [name] reports that she does not drink alcohol.   Review of Systems CONSTITUTIONAL: No weight loss, fever, chills, weakness or fatigue.  HEENT: Eyes: No visual loss, blurred vision, double vision or yellow sclerae.No hearing loss, sneezing, congestion, runny nose or sore throat.  SKIN: No rash or itching.  CARDIOVASCULAR: per HPI RESPIRATORY: No shortness of breath, cough or sputum.  GASTROINTESTINAL: No anorexia, nausea, vomiting or diarrhea. No abdominal pain or blood.  GENITOURINARY: No burning on urination, no polyuria NEUROLOGICAL: No headache, dizziness, syncope, paralysis, ataxia, numbness or tingling in the extremities. No change  in bowel or bladder control.  MUSCULOSKELETAL: No muscle, back pain, joint pain or stiffness.  LYMPHATICS: No enlarged nodes. No history of splenectomy.  PSYCHIATRIC: No history of depression or anxiety.  ENDOCRINOLOGIC: No reports of sweating, cold or heat intolerance. No polyuria or polydipsia.  Marland Kitchen   Physical Examination p 51 bp 117/72 Wt 124 lbs BMI 23 Gen: resting comfortably, no acute distress HEENT: no scleral icterus, pupils equal round and reactive, no palptable cervical adenopathy,  CV: RRR, no m/r/g, no JVD, no carotid bruits Resp: Clear to auscultation bilaterally GI: abdomen is soft, non-tender, non-distended, normal bowel sounds, no hepatosplenomegaly MSK: extremities are warm, no edema.  Skin: warm, no rash Neuro:   no focal deficits Psych: appropriate affect   Diagnostic Studies 07/20/13 Clinic EKG:  Sinus rhythm    Assessment and Plan  1. CAD - no current symptoms, continue risk factor modification and secondary prevention  2. Renal artery stenosis - no history of renal dysfunction, normally controlled blood pressures - continue to follow clinically, repeat BMET  3. HTN - at goal, she is actually taking her lopressore 25mg  once a day. Will change to 12.5mg  bid in the setting of sinus bradycardia.  4. Sinus bradycardia - likely related to beta blocker, will decrease dose. She does not have any symptoms. Will also check TSH.  5. Hyperlipidemia - repeat lipid panel, continue current statin. Pending results may intensify therapy, given her CAD history can consider high dose statin based on recent lipid guidelines.   Follow up 1 year   Arnoldo Lenis, M.D., F.A.C.C.

## 2013-08-16 ENCOUNTER — Telehealth: Payer: Self-pay | Admitting: Cardiology

## 2013-08-16 NOTE — Telephone Encounter (Signed)
Pt has not had lab work completed.

## 2013-09-06 ENCOUNTER — Other Ambulatory Visit: Payer: Self-pay | Admitting: Cardiology

## 2013-09-06 LAB — CBC
HEMATOCRIT: 38.1 % (ref 36.0–46.0)
HEMOGLOBIN: 13 g/dL (ref 12.0–15.0)
MCH: 29.9 pg (ref 26.0–34.0)
MCHC: 34.1 g/dL (ref 30.0–36.0)
MCV: 87.6 fL (ref 78.0–100.0)
PLATELETS: 243 10*3/uL (ref 150–400)
RBC: 4.35 MIL/uL (ref 3.87–5.11)
RDW: 14.4 % (ref 11.5–15.5)
WBC: 4.4 10*3/uL (ref 4.0–10.5)

## 2013-09-06 LAB — BASIC METABOLIC PANEL
BUN: 23 mg/dL (ref 6–23)
CO2: 28 meq/L (ref 19–32)
CREATININE: 0.95 mg/dL (ref 0.50–1.10)
Calcium: 9.1 mg/dL (ref 8.4–10.5)
Chloride: 108 mEq/L (ref 96–112)
Glucose, Bld: 84 mg/dL (ref 70–99)
POTASSIUM: 4.4 meq/L (ref 3.5–5.3)
SODIUM: 142 meq/L (ref 135–145)

## 2013-09-06 LAB — LIPID PANEL
CHOL/HDL RATIO: 3.3 ratio
CHOLESTEROL: 219 mg/dL — AB (ref 0–200)
HDL: 66 mg/dL (ref >39)
LDL Cholesterol: 140 mg/dL — ABNORMAL HIGH (ref 0–99)
Triglycerides: 67 mg/dL (ref <150)
VLDL: 13 mg/dL (ref 0–40)

## 2013-09-07 ENCOUNTER — Telehealth: Payer: Self-pay | Admitting: Cardiology

## 2013-09-07 LAB — TSH: TSH: 0.805 u[IU]/mL (ref 0.350–4.500)

## 2013-09-07 NOTE — Telephone Encounter (Signed)
Left VM for pt to return call.

## 2013-09-08 NOTE — Telephone Encounter (Signed)
Ok, thank you for update

## 2013-09-08 NOTE — Telephone Encounter (Signed)
Called and informed of results. Pt informed me that she has not been taking her simvastatin. Pt states that the reason she does not take medication is because it makes her cramp and joint pains when she takes it for a month straight. Pt does not want to try a new medications.

## 2014-01-11 ENCOUNTER — Telehealth: Payer: Self-pay | Admitting: Cardiology

## 2014-01-11 NOTE — Telephone Encounter (Signed)
Wanting to her medication for cholesterol

## 2014-01-12 NOTE — Telephone Encounter (Signed)
Message left on voice mail - can't take Simvastatin, wants to change to something else.  Attempted to return call - left message.

## 2014-01-17 NOTE — Telephone Encounter (Signed)
Left message to return call 

## 2014-01-17 NOTE — Telephone Encounter (Signed)
Patient walked into office.  Requested copy of most recent labs.  Patient directed to [facility]) to sign release.    Also, stated that she couldn't tolerate the Simvastatin.  Per last telephone note, patient stated that she had stopped this medication.  Questioned her about this - she stated that she had been trying it off / on since then & still can not tolerate.  Stated that she would like MD to suggest a different medication.

## 2014-01-18 NOTE — Telephone Encounter (Signed)
I would suggest pravastatin 20mg  daily and see how she does. Please clarify what symptoms she is having on simvastatin   Zandra Abts  MD

## 2014-01-20 NOTE — Telephone Encounter (Signed)
Simvastatin - cramps.  States that she will continue the Simvastatin as she was previously taking (20mg  daily).  States someone told her to take with a MVI daily & that may help.  States she will do that also.  Will cal back if she changes her mind.

## 2014-07-08 HISTORY — PX: CHOLECYSTECTOMY: SHX55

## 2014-08-12 ENCOUNTER — Encounter: Payer: Self-pay | Admitting: *Deleted

## 2014-08-12 ENCOUNTER — Ambulatory Visit (INDEPENDENT_AMBULATORY_CARE_PROVIDER_SITE_OTHER): Payer: Medicare Other | Admitting: Cardiology

## 2014-08-12 ENCOUNTER — Encounter: Payer: Self-pay | Admitting: Cardiology

## 2014-08-12 VITALS — BP 138/64 | HR 51 | Ht 60.0 in | Wt 116.0 lb

## 2014-08-12 DIAGNOSIS — I701 Atherosclerosis of renal artery: Secondary | ICD-10-CM

## 2014-08-12 DIAGNOSIS — E785 Hyperlipidemia, unspecified: Secondary | ICD-10-CM

## 2014-08-12 DIAGNOSIS — I10 Essential (primary) hypertension: Secondary | ICD-10-CM

## 2014-08-12 DIAGNOSIS — I2510 Atherosclerotic heart disease of native coronary artery without angina pectoris: Secondary | ICD-10-CM

## 2014-08-12 MED ORDER — PRAVASTATIN SODIUM 20 MG PO TABS: 20.0000 mg | ORAL_TABLET | Freq: Every evening | ORAL | Status: DC

## 2014-08-12 NOTE — Progress Notes (Signed)
Clinical Summary [name] is a 81 y.o.female seen today for follow up of the following medical problems.   1. CAD - prior 3 vessel CABG in 2008 as described below at [facility] - no recent chest pain. No SOB or DOE.  - compliant with meds. Lightheadedness worst with lower dose of Toprol she reports, she went back to her previous 25mg  daily.   2. Renal artery stenosis - described as 50% from prior notes - no history of renal dysfunction or resistant HTN.   3. HTN - doesn't check at home - compliant with meds  4. Hyperlipidemia - muscle cramps on zocor, has not wanted to try alternative statin - no recent lipid panel     Past Medical History  Diagnosis Date  . Coronary artery disease     NSTEMI/high grade left main disease  . COPD, severe     Question history of asthma  . Hypertension   . Hypothyroidism   . Dyslipidemia   . Peripheral vascular disease     50% left RAS  . RBBB (right bundle Omaria Plunk block)   . Status post coronary artery bypass grafting     status post cabbage 2008     Allergies  Allergen Reactions  . Codeine     REACTION: rashes swelling     Current Outpatient Prescriptions  Medication Sig Dispense Refill  . albuterol (PROAIR HFA) 108 (90 BASE) MCG/ACT inhaler Inhale 2 puffs into the lungs every 6 (six) hours as needed for wheezing or shortness of breath.    Marland Kitchen aspirin 81 MG EC tablet Take 81 mg by mouth daily.      . Ipratropium-Albuterol (COMBIVENT RESPIMAT) 20-100 MCG/ACT AERS respimat Inhale 1 puff into the lungs every 6 (six) hours.    Marland Kitchen levothyroxine (SYNTHROID, LEVOTHROID) 100 MCG tablet Take 100 mcg by mouth daily.    Marland Kitchen lisinopril-hydrochlorothiazide (PRINZIDE,ZESTORETIC) 20-12.5 MG per tablet Take 1 tablet by mouth daily.      . metoprolol tartrate (LOPRESSOR) 12.5 mg TABS tablet Take 0.5 tablets (12.5 mg total) by mouth 2 (two) times daily. 60 tablet 6  . nitroGLYCERIN (NITROSTAT) 0.4 MG SL tablet Place 0.4 mg under the tongue  every 5 (five) minutes as needed. May repeat up to 3 doses.    . simvastatin (ZOCOR) 20 MG tablet Take 20 mg by mouth at bedtime.       No current facility-administered medications for this visit.     Past Surgical History  Procedure Laterality Date  . Coronary artery bypass graft  3/08    Three-vessel: LIMA-LAD; SCG-OM1; SVG-RCA; normal LV function     Allergies  Allergen Reactions  . Codeine     REACTION: rashes swelling      No family history on file.   Social History Ms. Westrich reports that she quit smoking about 8 years ago. Her smoking use included Cigarettes. She has a 44 pack-year smoking history. She has never used smokeless tobacco. Ms. Paulette reports that she does not drink alcohol.   Review of Systems CONSTITUTIONAL: No weight loss, fever, chills, weakness or fatigue.  HEENT: Eyes: No visual loss, blurred vision, double vision or yellow sclerae.No hearing loss, sneezing, congestion, runny nose or sore throat.  SKIN: No rash or itching.  CARDIOVASCULAR: per HPI RESPIRATORY: No shortness of breath, cough or sputum.  GASTROINTESTINAL: No anorexia, nausea, vomiting or diarrhea. No abdominal pain or blood.  GENITOURINARY: No burning on urination, no polyuria NEUROLOGICAL: No headache, dizziness, syncope, paralysis,  ataxia, numbness or tingling in the extremities. No change in bowel or bladder control.  MUSCULOSKELETAL: No muscle, back pain, joint pain or stiffness.  LYMPHATICS: No enlarged nodes. No history of splenectomy.  PSYCHIATRIC: No history of depression or anxiety.  ENDOCRINOLOGIC: No reports of sweating, cold or heat intolerance. No polyuria or polydipsia.  Marland Kitchen   Physical Examination p 51 bp 138/64 Wt 116 lbs BMI 23 Gen: resting comfortably, no acute distress HEENT: no scleral icterus, pupils equal round and reactive, no palptable cervical adenopathy,  CV: RRR, no m/r/g, no JVD, no carotid bruits Resp: Clear to auscultation bilaterally GI: abdomen is  soft, non-tender, non-distended, normal bowel sounds, no hepatosplenomegaly MSK: extremities are warm, no edema.  Skin: warm, no rash Neuro:  no focal deficits Psych: appropriate affec    Assessment and Plan   1. CAD - no current symptoms, continue risk factor modification and secondary prevention  2. Renal artery stenosis - no history of renal dysfunction, controlled blood pressures - continue to follow clinically  3. HTN - at goal, continue current meds  4. Hyperlipidemia - she is willing to try pravastatin, will start 20mg  daily.    F/u 1 year  Arnoldo Lenis, M.D.

## 2014-08-12 NOTE — Patient Instructions (Signed)
Your physician wants you to follow-up in: [place] DR. BRANCH You will receive a reminder letter in the mail two months in advance. If you don't receive a letter, please call our office to schedule the follow-up appointment.  Your physician has recommended you make the following change in your medication:   START ASPIRIN 81 MG DAILY  START PRAVASTATIN 20 MG DAILY  WE WILL REQUEST YOUR ECHO FROM [facility]  Thank you for choosing [facility]!!

## 2014-08-16 ENCOUNTER — Telehealth: Payer: Self-pay | Admitting: *Deleted

## 2014-08-16 NOTE — Telephone Encounter (Signed)
Pt is c/o pravastatin intolerance, says she can tell BP is high (did not have any BP reading to provide). Pt has stopped taking pravastatin and wants to go back to simvastatin or other recommendations by Dr. Harl Bowie. Will forward

## 2014-08-16 NOTE — Telephone Encounter (Signed)
Pravastatin would not affect her blood pressure. Have there been any other side effects? If mild/tolerable they often improve a few days on the medication.   Zandra Abts MD

## 2014-08-16 NOTE — Telephone Encounter (Signed)
Ok to stop. Will not try a diff statin at this time   Zandra Abts MD

## 2014-08-16 NOTE — Telephone Encounter (Signed)
Pt c/o of headaches, not feeling well after taking pravastatin for 2 days. Does not want to continue on pravastatin

## 2014-08-16 NOTE — Telephone Encounter (Signed)
Pt made aware

## 2015-10-05 ENCOUNTER — Encounter: Payer: Self-pay | Admitting: Family Medicine

## 2015-10-05 ENCOUNTER — Ambulatory Visit (INDEPENDENT_AMBULATORY_CARE_PROVIDER_SITE_OTHER): Payer: Medicare Other | Admitting: Family Medicine

## 2015-10-05 VITALS — BP 119/58 | HR 50 | Temp 97.4°F | Ht 61.52 in | Wt 111.8 lb

## 2015-10-05 DIAGNOSIS — F17201 Nicotine dependence, unspecified, in remission: Secondary | ICD-10-CM | POA: Insufficient documentation

## 2015-10-05 DIAGNOSIS — J42 Unspecified chronic bronchitis: Secondary | ICD-10-CM | POA: Diagnosis not present

## 2015-10-05 DIAGNOSIS — E030 Congenital hypothyroidism with diffuse goiter: Secondary | ICD-10-CM

## 2015-10-05 DIAGNOSIS — E785 Hyperlipidemia, unspecified: Secondary | ICD-10-CM

## 2015-10-05 DIAGNOSIS — I10 Essential (primary) hypertension: Secondary | ICD-10-CM

## 2015-10-05 DIAGNOSIS — Z951 Presence of aortocoronary bypass graft: Secondary | ICD-10-CM

## 2015-10-05 MED ORDER — FLUTICASONE FUROATE-VILANTEROL 100-25 MCG/INH IN AEPB: 1.0000 | INHALATION_SPRAY | Freq: Every day | RESPIRATORY_TRACT | Status: DC

## 2015-10-05 NOTE — Progress Notes (Signed)
   HPI  Patient presents today to establish care.  Patient has no complaints today.  She claims that she takes Combivent several times a day as needed, she considers this her controller inhaler. She also uses albuterol. She gets Combivent from [place] due to the cost , She spends about $78 a month on this.  She has good medication compliance. She denies any chest pain or shortness of breath today. [facility]  She is s/p 3 vessel CABG in 2008 Former truck driver Had statin intolerance X 2, pravastatin and 80 mg lipitor- notes say zocor and lipitor, she remember prava  PMH: Smoking status noted Her past medical, surgical, social, family history reviewed and updated in EMR ROS: Per HPI  Objective: BP 119/58 mmHg  Pulse 50  Temp(Src) 97.4 F (36.3 C) (Oral)  Ht 5' 1.52" (1.563 m)  Wt 111 lb 12.8 oz (50.712 kg)  BMI 20.76 kg/m2 Gen: NAD, alert, cooperative with exam HEENT: NCAT CV: RRR, good S1/S2, no murmur Resp: CTABL, no wheezes, non-labored Abd: SNTND, BS present, no guarding or organomegaly Ext: No edema, warm Neuro: Alert and oriented, strength 5/5 and sensation intact in bilateral upper and lower extremities   Assessment and plan:  # Hypertension Well-controlled, on Prinzide and metoprolol  # CAD, status post CABG no chest pain, on beta blocker, aspirin Would like to see cardiology and [place]  # COPD Some concern for reactive airway disease as well Currently using Combivent and albuterol Discussed at length controller inhalers versus rescue inhalers. Combivent could be considered a controller, although I feel that it's important controller. Given samples of breathing today, I sent a prescription so she can check the price and coverage with her insurance.  # HLD S/p CABG, Statin would be very beneficial Previous statin intolerance, would consider re-try of simva, 40 mg may work Did not tolerate 80 mg lipitor Labs, Follow up  discussion in 3-4 weeks    Orders Placed This Encounter  Procedures  . CMP14+EGFR    Standing Status: Future     Number of Occurrences:      Standing Expiration Date: 10/04/2016  . CBC with Differential    Standing Status: Future     Number of Occurrences:      Standing Expiration Date: 10/04/2016  . Lipid Panel    Standing Status: Future     Number of Occurrences:      Standing Expiration Date: 10/04/2016  . TSH    Standing Status: Future     Number of Occurrences:      Standing Expiration Date: 10/04/2016    Meds ordered this encounter  Medications  . fluticasone furoate-vilanterol (BREO ELLIPTA) 100-25 MCG/INH AEPB    Sig: Inhale 1 puff into the lungs daily.    Dispense:  28 each    Refill:  Taylor, MD [facility] 10/05/2015, 1:50 PM

## 2015-10-05 NOTE — Patient Instructions (Signed)
   Great to meet you!  Try breo for 1 month, I have sent prescriptions to see if it is affordable  Come back in 3-4 weeks, fasting and we will talk about your breathing (COPD)

## 2015-10-06 ENCOUNTER — Telehealth: Payer: Self-pay | Admitting: Family Medicine

## 2015-10-18 ENCOUNTER — Telehealth: Payer: Self-pay | Admitting: Family Medicine

## 2015-10-18 NOTE — Telephone Encounter (Signed)
Patient called stating that she was having headaches from breo and will not use this anymore.

## 2015-11-01 ENCOUNTER — Other Ambulatory Visit: Payer: Self-pay

## 2015-11-01 DIAGNOSIS — I25810 Atherosclerosis of coronary artery bypass graft(s) without angina pectoris: Secondary | ICD-10-CM

## 2015-11-01 DIAGNOSIS — Z951 Presence of aortocoronary bypass graft: Secondary | ICD-10-CM

## 2015-11-30 ENCOUNTER — Other Ambulatory Visit: Payer: Medicare Other

## 2015-11-30 DIAGNOSIS — I10 Essential (primary) hypertension: Secondary | ICD-10-CM

## 2015-11-30 DIAGNOSIS — E785 Hyperlipidemia, unspecified: Secondary | ICD-10-CM

## 2015-11-30 DIAGNOSIS — Z951 Presence of aortocoronary bypass graft: Secondary | ICD-10-CM

## 2015-11-30 DIAGNOSIS — E030 Congenital hypothyroidism with diffuse goiter: Secondary | ICD-10-CM

## 2015-12-01 LAB — LIPID PANEL
Chol/HDL Ratio: 3.1 ratio units (ref 0.0–4.4)
Cholesterol, Total: 210 mg/dL — ABNORMAL HIGH (ref 100–199)
HDL: 67 mg/dL (ref >39)
LDL Calculated: 128 mg/dL — ABNORMAL HIGH (ref 0–99)
TRIGLYCERIDES: 76 mg/dL (ref 0–149)
VLDL Cholesterol Cal: 15 mg/dL (ref 5–40)

## 2015-12-01 LAB — CMP14+EGFR
A/G RATIO: 1.8 (ref 1.2–2.2)
ALK PHOS: 92 IU/L (ref 39–117)
ALT: 13 IU/L (ref 0–32)
AST: 21 IU/L (ref 0–40)
Albumin: 4.3 g/dL (ref 3.5–4.7)
BUN/Creatinine Ratio: 21 (ref 12–28)
BUN: 22 mg/dL (ref 8–27)
Bilirubin Total: 0.3 mg/dL (ref 0.0–1.2)
CO2: 21 mmol/L (ref 18–29)
Calcium: 9.6 mg/dL (ref 8.7–10.3)
Chloride: 103 mmol/L (ref 96–106)
Creatinine, Ser: 1.03 mg/dL — ABNORMAL HIGH (ref 0.57–1.00)
GFR calc Af Amer: 59 mL/min/{1.73_m2} — ABNORMAL LOW (ref >59)
GFR calc non Af Amer: 51 mL/min/{1.73_m2} — ABNORMAL LOW (ref >59)
GLOBULIN, TOTAL: 2.4 g/dL (ref 1.5–4.5)
Glucose: 88 mg/dL (ref 65–99)
POTASSIUM: 4.2 mmol/L (ref 3.5–5.2)
SODIUM: 142 mmol/L (ref 134–144)
Total Protein: 6.7 g/dL (ref 6.0–8.5)

## 2015-12-01 LAB — CBC WITH DIFFERENTIAL/PLATELET
Basophils Absolute: 0 10*3/uL (ref 0.0–0.2)
Basos: 1 %
EOS (ABSOLUTE): 0.2 10*3/uL (ref 0.0–0.4)
Eos: 4 %
Hematocrit: 40.9 % (ref 34.0–46.6)
Hemoglobin: 13.2 g/dL (ref 11.1–15.9)
IMMATURE GRANULOCYTES: 0 %
Immature Grans (Abs): 0 10*3/uL (ref 0.0–0.1)
Lymphocytes Absolute: 1.6 10*3/uL (ref 0.7–3.1)
Lymphs: 29 %
MCH: 28.6 pg (ref 26.6–33.0)
MCHC: 32.3 g/dL (ref 31.5–35.7)
MCV: 89 fL (ref 79–97)
MONOS ABS: 0.3 10*3/uL (ref 0.1–0.9)
Monocytes: 5 %
NEUTROS PCT: 61 %
Neutrophils Absolute: 3.5 10*3/uL (ref 1.4–7.0)
PLATELETS: 256 10*3/uL (ref 150–379)
RBC: 4.62 x10E6/uL (ref 3.77–5.28)
RDW: 13.1 % (ref 12.3–15.4)
WBC: 5.6 10*3/uL (ref 3.4–10.8)

## 2015-12-01 LAB — TSH: TSH: 0.097 u[IU]/mL — ABNORMAL LOW (ref 0.450–4.500)

## 2015-12-05 ENCOUNTER — Other Ambulatory Visit: Payer: Self-pay | Admitting: Family Medicine

## 2015-12-05 MED ORDER — LEVOTHYROXINE SODIUM 75 MCG PO TABS: 75.0000 ug | ORAL_TABLET | Freq: Every day | ORAL | Status: DC

## 2015-12-05 MED ORDER — PITAVASTATIN CALCIUM 2 MG PO TABS: 2.0000 mg | ORAL_TABLET | Freq: Every day | ORAL | Status: DC

## 2015-12-07 ENCOUNTER — Other Ambulatory Visit: Payer: Self-pay | Admitting: Family Medicine

## 2015-12-08 MED ORDER — IPRATROPIUM-ALBUTEROL 18-103 MCG/ACT IN AERO: 1.0000 | INHALATION_SPRAY | Freq: Four times a day (QID) | RESPIRATORY_TRACT | Status: DC

## 2015-12-08 NOTE — Telephone Encounter (Signed)
Rx to be called in to desired pharmacy  Laroy Apple, MD [facility] 12/08/2015, 12:39 PM

## 2015-12-09 ENCOUNTER — Other Ambulatory Visit: Payer: Self-pay | Admitting: Family Medicine

## 2015-12-15 NOTE — Telephone Encounter (Signed)
Fax sent.

## 2015-12-21 ENCOUNTER — Telehealth: Payer: Self-pay | Admitting: Family Medicine

## 2015-12-21 NOTE — Telephone Encounter (Signed)
Pt notified of recommendation Verbalizes understanding 

## 2015-12-21 NOTE — Telephone Encounter (Signed)
Patient states that since changing her Levothyroxine from 100 mcg to 75 mcg everything she eats tastes salty.  She talked to her pharmacist and they told her this was normal.  She wanted to get your opinion and see if you have any recommendations.

## 2015-12-21 NOTE — Telephone Encounter (Signed)
Although generic Synthroid is levothyroxine sodium this is not the typical sodium chloride you see in table salt.  I was not able to find any reports of patient experiencing a salty taste after taking levothyroxine.  I agree with Dr Wendi Snipes - continue to take new dose of 59mcg daily.  Maybe drink about 4 ounce of water along with the levothyroxine dose.

## 2015-12-21 NOTE — Telephone Encounter (Signed)
Thesalty taste is an unusal reaction to me. I would watch and wait, repeat TSH 3 months after change like usual.   Will ask pharmacists opinion.   Laroy Apple, MD [facility] 12/21/2015, 12:21 PM

## 2015-12-22 ENCOUNTER — Other Ambulatory Visit: Payer: Self-pay | Admitting: *Deleted

## 2015-12-25 ENCOUNTER — Other Ambulatory Visit: Payer: Self-pay

## 2016-03-12 ENCOUNTER — Other Ambulatory Visit: Payer: Self-pay | Admitting: Family Medicine

## 2016-03-12 ENCOUNTER — Encounter: Payer: Self-pay | Admitting: Family Medicine

## 2016-03-12 ENCOUNTER — Ambulatory Visit (INDEPENDENT_AMBULATORY_CARE_PROVIDER_SITE_OTHER): Payer: Medicare Other | Admitting: Family Medicine

## 2016-03-12 VITALS — BP 139/67 | HR 57 | Temp 96.6°F | Ht 61.52 in | Wt 111.2 lb

## 2016-03-12 DIAGNOSIS — I25810 Atherosclerosis of coronary artery bypass graft(s) without angina pectoris: Secondary | ICD-10-CM | POA: Diagnosis not present

## 2016-03-12 DIAGNOSIS — E785 Hyperlipidemia, unspecified: Secondary | ICD-10-CM

## 2016-03-12 DIAGNOSIS — I10 Essential (primary) hypertension: Secondary | ICD-10-CM

## 2016-03-12 DIAGNOSIS — E030 Congenital hypothyroidism with diffuse goiter: Secondary | ICD-10-CM | POA: Diagnosis not present

## 2016-03-12 MED ORDER — LEVOTHYROXINE SODIUM 100 MCG PO TABS: 100.0000 ug | ORAL_TABLET | Freq: Every day | ORAL | 0 refills | Status: DC

## 2016-03-12 NOTE — Addendum Note (Signed)
Addended by: Nigel Berthold C on: 03/12/2016 01:01 PM   Modules accepted: Orders

## 2016-03-12 NOTE — Progress Notes (Signed)
   HPI  Patient presents today here for emergency room follow-up.  Patient explains that she was seen on 03/06/2016 for dizziness and fatigue. She states her blood pressure was as high as 200 both she was in the hospital. They did several tests including blood work, EKG, CT head. She states that ultimately it was explained that her thyroid was low in her medication dose was increased back to 100 g.  Patient states that she took 75 g for 2 months without a problem.  Her dizziness has completely resolved, her fatigue is also improved.  She did not tolerate Pitavistatin due to elevated blood pressure and intolerance of the medication with puffy eyes.   PMH: Smoking status noted ROS: Per HPI  Objective: BP 139/67   Pulse (!) 57   Temp (!) 96.6 F (35.9 C) (Oral)   Ht 5' 1.52" (1.563 m)   Wt 111 lb 3.2 oz (50.4 kg)   BMI 20.66 kg/m  Gen: NAD, alert, cooperative with exam HEENT: NCAT CV: RRR, good S1/S2, no murmur Resp: CTABL, no wheezes, non-labored Ext: No edema, warm Neuro: Alert and oriented, No gross deficits  Labs from the emergency room, 03/06/2016. TSH 1.96 Creatinine 1.05, GFR 50 Potassium 3.8.  Assessment and plan:  # Hypothyroidism Unlikely to be the true etiology of her episode that interpreted the emergency room, however she is clinically improved after changing the amount of Synthroid she is on. Follow-up in 2 months for recheck TSH. I discussed gently that this is likely the case. She is convinced that her thyroid.  # Hypertension Well-controlled today Continue current medications Labs next visit Beta blocker, ACE inhibitor and HCTZ  # Hyperlipidemia With history of CABG Did not tolerate Livalo Refer to clinical pharmacist to consider PCSK -9 inhibitor  # Dizziness Acute episode, she went to the emergency room it is not resolved. Patient feels it is due to low thyroid, we had a brief discussion about this being unlikely today, rechecking thyroid in 1  month, I'm glad that her episode has resolved. Presumably they ruled out stroke in the emergency room with CT head.  Records requested.  Meds ordered this encounter  Medications  . levothyroxine (SYNTHROID, LEVOTHROID) 100 MCG tablet    Laroy Apple, MD [facility] 03/12/2016, 10:22 AM

## 2016-03-12 NOTE — Patient Instructions (Signed)
Great to see you!  Come back in [place] for labs and follow up about your thyroid  Make an appointment to discuss cholesterol medicine with one of our pharmacists

## 2016-03-13 ENCOUNTER — Telehealth: Payer: Self-pay | Admitting: Family Medicine

## 2016-03-19 ENCOUNTER — Other Ambulatory Visit: Payer: Self-pay | Admitting: Family Medicine

## 2016-03-20 ENCOUNTER — Other Ambulatory Visit: Payer: Self-pay | Admitting: *Deleted

## 2016-05-13 ENCOUNTER — Ambulatory Visit: Payer: Medicare Other | Admitting: Family Medicine

## 2016-06-28 ENCOUNTER — Other Ambulatory Visit: Payer: Self-pay | Admitting: *Deleted

## 2016-07-02 NOTE — Progress Notes (Signed)
error 

## 2016-09-15 ENCOUNTER — Other Ambulatory Visit: Payer: Self-pay | Admitting: Family Medicine

## 2016-09-27 ENCOUNTER — Ambulatory Visit: Payer: Medicare Other | Admitting: Family Medicine

## 2016-10-22 ENCOUNTER — Other Ambulatory Visit: Payer: Self-pay | Admitting: Family Medicine

## 2016-10-22 ENCOUNTER — Ambulatory Visit (INDEPENDENT_AMBULATORY_CARE_PROVIDER_SITE_OTHER): Payer: Medicare Other | Admitting: Family Medicine

## 2016-10-22 ENCOUNTER — Encounter: Payer: Self-pay | Admitting: Family Medicine

## 2016-10-22 VITALS — BP 134/67 | HR 53 | Temp 97.3°F | Ht 61.52 in | Wt 106.8 lb

## 2016-10-22 DIAGNOSIS — I10 Essential (primary) hypertension: Secondary | ICD-10-CM | POA: Diagnosis not present

## 2016-10-22 DIAGNOSIS — E785 Hyperlipidemia, unspecified: Secondary | ICD-10-CM

## 2016-10-22 DIAGNOSIS — E039 Hypothyroidism, unspecified: Secondary | ICD-10-CM | POA: Diagnosis not present

## 2016-10-22 MED ORDER — LEVOTHYROXINE SODIUM 75 MCG PO TABS: 75.0000 ug | ORAL_TABLET | Freq: Every day | ORAL | 2 refills | Status: DC

## 2016-10-22 NOTE — Progress Notes (Signed)
   HPI  Patient presents today here for follow-up of hypothyroidism.  Patient also has some questions about her previous provider's medical records which we have reviewed.  Thyroid Patient states she has decreased energy with 50 g Synthroid, previously checked her TSH which was very low, we changed her from 100-75 mcgdaily. She then followed up with her previous provider who changed her to 88 g, then 75, now 50 g. She would like to go back up, however she would also like to have her labs repeated.  HTN Good medication compliance, no chest pain.   HLD NO medications, previously turned down offer/recommendation of statin.   PMH: Smoking status noted ROS: Per HPI  Objective: BP 134/67   Pulse (!) 53   Temp 97.3 F (36.3 C) (Oral)   Ht 5' 1.52" (1.563 m)   Wt 106 lb 12.8 oz (48.4 kg)   BMI 19.84 kg/m  Gen: NAD, alert, cooperative with exam HEENT: NCAT CV: RRR, good S1/S2, no murmur Resp: CTABL, no wheezes, non-labored Ext: No edema, warm Neuro: Alert and oriented, No gross deficits  Assessment and plan:  # Hypothyroidism Repeat TSH, Increased to 75 mcg today based on symptoms, may need to to stay at 50 ( has 4 days left) if TSH WNL   # HLD Labs ordered, direct as she is not fasting No medications, previously offered statin ( note CAD with CABG also)  HTN Well controlled onAce plus HCTZ plus beta blocker No changes Labs  Orders Placed This Encounter  Procedures  . TSH  . LDL Cholesterol, Direct  . BMP8+EGFR  . CBC with Differential/Platelet    Meds ordered this encounter  Medications  . DISCONTD: levothyroxine (SYNTHROID, LEVOTHROID) 50 MCG tablet    Sig: Take 1 tablet by mouth daily.  . levothyroxine (SYNTHROID, LEVOTHROID) 75 MCG tablet    Sig: Take 1 tablet (75 mcg total) by mouth daily.    Dispense:  30 tablet    Refill:  2    Sam Bradshaw, MD [facility] 10/22/2016, 5:16 PM     

## 2016-10-22 NOTE — Patient Instructions (Signed)
Great to see you!  We will call with thyroid testing within 1 week.   I have changed you to 75 mcg of synthroid once daily.

## 2016-10-23 LAB — CBC WITH DIFFERENTIAL/PLATELET
BASOS ABS: 0 10*3/uL (ref 0.0–0.2)
Basos: 0 %
EOS (ABSOLUTE): 0.2 10*3/uL (ref 0.0–0.4)
Eos: 2 %
HEMOGLOBIN: 12.6 g/dL (ref 11.1–15.9)
Hematocrit: 38 % (ref 34.0–46.6)
Immature Grans (Abs): 0 10*3/uL (ref 0.0–0.1)
Immature Granulocytes: 0 %
LYMPHS ABS: 2.3 10*3/uL (ref 0.7–3.1)
Lymphs: 31 %
MCH: 27.9 pg (ref 26.6–33.0)
MCHC: 33.2 g/dL (ref 31.5–35.7)
MCV: 84 fL (ref 79–97)
Monocytes Absolute: 0.5 10*3/uL (ref 0.1–0.9)
Monocytes: 7 %
NEUTROS ABS: 4.5 10*3/uL (ref 1.4–7.0)
Neutrophils: 60 %
PLATELETS: 277 10*3/uL (ref 150–379)
RBC: 4.51 x10E6/uL (ref 3.77–5.28)
RDW: 15.3 % (ref 12.3–15.4)
WBC: 7.6 10*3/uL (ref 3.4–10.8)

## 2016-10-23 LAB — BMP8+EGFR
BUN / CREAT RATIO: 23 (ref 12–28)
BUN: 24 mg/dL (ref 8–27)
CHLORIDE: 103 mmol/L (ref 96–106)
CO2: 21 mmol/L (ref 18–29)
Calcium: 9.2 mg/dL (ref 8.7–10.3)
Creatinine, Ser: 1.04 mg/dL — ABNORMAL HIGH (ref 0.57–1.00)
GFR calc Af Amer: 58 mL/min/{1.73_m2} — ABNORMAL LOW (ref >59)
GFR calc non Af Amer: 50 mL/min/{1.73_m2} — ABNORMAL LOW (ref >59)
GLUCOSE: 91 mg/dL (ref 65–99)
Potassium: 3.9 mmol/L (ref 3.5–5.2)
Sodium: 142 mmol/L (ref 134–144)

## 2016-10-23 LAB — LDL CHOLESTEROL, DIRECT: LDL DIRECT: 139 mg/dL — AB (ref 0–99)

## 2016-10-23 LAB — TSH: TSH: 8.24 u[IU]/mL — ABNORMAL HIGH (ref 0.450–4.500)

## 2016-10-31 ENCOUNTER — Telehealth: Payer: Self-pay | Admitting: Family Medicine

## 2016-10-31 NOTE — Telephone Encounter (Signed)
FYI

## 2016-12-01 ENCOUNTER — Other Ambulatory Visit: Payer: Self-pay | Admitting: Family Medicine

## 2016-12-03 ENCOUNTER — Telehealth: Payer: Self-pay | Admitting: Family Medicine

## 2016-12-03 MED ORDER — IPRATROPIUM-ALBUTEROL 20-100 MCG/ACT IN AERS: 1.0000 | INHALATION_SPRAY | Freq: Four times a day (QID) | RESPIRATORY_TRACT | 3 refills | Status: DC

## 2016-12-03 MED ORDER — ALBUTEROL SULFATE HFA 108 (90 BASE) MCG/ACT IN AERS: 2.0000 | INHALATION_SPRAY | Freq: Four times a day (QID) | RESPIRATORY_TRACT | 2 refills | Status: DC | PRN

## 2016-12-03 NOTE — Telephone Encounter (Signed)
Pt is using combivent, this should be scheduled. Since it is a respimat I would not necessarily recommend using it as a rescue inhaler.   Albuterol sent also.   Laroy Apple, MD [facility] 12/03/2016, 3:09 PM

## 2016-12-03 NOTE — Telephone Encounter (Signed)
NA no VM at pt #-[id number]

## 2016-12-03 NOTE — Telephone Encounter (Signed)
I refilled the combivent - the doses arnt quite the same - epic made me reorder -- can you just make sure this one is ok????

## 2016-12-12 ENCOUNTER — Telehealth: Payer: Self-pay | Admitting: Family Medicine

## 2016-12-12 NOTE — Telephone Encounter (Signed)
Patient is requesting a sample of Breo. She states that the combivent is too expensive. Please review

## 2016-12-13 NOTE — Telephone Encounter (Signed)
Please advise 

## 2016-12-16 MED ORDER — FLUTICASONE FUROATE-VILANTEROL 200-25 MCG/INH IN AEPB: 1.0000 | INHALATION_SPRAY | Freq: Every day | RESPIRATORY_TRACT | 11 refills | Status: DC

## 2016-12-16 NOTE — Telephone Encounter (Signed)
Ok with samples, Rx sent.   Please review- not a rescue inhaler. Take 1 puff once daily for prevention.   Carla Apple, MD [facility] 12/16/2016, 7:51 AM

## 2016-12-16 NOTE — Telephone Encounter (Signed)
Samples to front for pt pick up Left detailed message for pt

## 2016-12-24 ENCOUNTER — Ambulatory Visit (INDEPENDENT_AMBULATORY_CARE_PROVIDER_SITE_OTHER): Payer: Medicare Other | Admitting: Family Medicine

## 2016-12-24 ENCOUNTER — Encounter: Payer: Self-pay | Admitting: Family Medicine

## 2016-12-24 VITALS — BP 109/55 | HR 50 | Temp 97.4°F | Ht 61.0 in | Wt 103.0 lb

## 2016-12-24 DIAGNOSIS — Z951 Presence of aortocoronary bypass graft: Secondary | ICD-10-CM

## 2016-12-24 DIAGNOSIS — E785 Hyperlipidemia, unspecified: Secondary | ICD-10-CM

## 2016-12-24 DIAGNOSIS — E039 Hypothyroidism, unspecified: Secondary | ICD-10-CM | POA: Diagnosis not present

## 2016-12-24 DIAGNOSIS — J42 Unspecified chronic bronchitis: Secondary | ICD-10-CM | POA: Diagnosis not present

## 2016-12-24 MED ORDER — BUDESONIDE-FORMOTEROL FUMARATE 160-4.5 MCG/ACT IN AERO: 2.0000 | INHALATION_SPRAY | Freq: Two times a day (BID) | RESPIRATORY_TRACT | 12 refills | Status: DC

## 2016-12-24 MED ORDER — ALBUTEROL SULFATE HFA 108 (90 BASE) MCG/ACT IN AERS: 2.0000 | INHALATION_SPRAY | Freq: Four times a day (QID) | RESPIRATORY_TRACT | 3 refills | Status: DC | PRN

## 2016-12-24 NOTE — Progress Notes (Signed)
   HPI  Patient presents today here to f/u for chornic medical problems  COPD Needs albuterol refill, would like MDI controller, has been doing well on breo Still smoking  HLD Has statin intolearnce. Patient has tried simvastatin, pravastatin, and livalo with intolerance to all of them. She has a history of CABG  Hypothyroidism Feels much better on 75 g of Synthroid.   PMH: Smoking status noted ROS: Per HPI  Objective: BP (!) 109/55   Pulse (!) 50   Temp 97.4 F (36.3 C) (Oral)   Ht 5\' 1"  (1.549 m)   Wt 103 lb (46.7 kg)   BMI 19.46 kg/m  Gen: NAD, alert, cooperative with exam HEENT: NCAT CV: RRR, good S1/S2, no murmur Resp: CTABL, no wheezes, non-labored Ext: No edema, warm Neuro: Alert and oriented, No gross deficits  Assessment and plan:  # COPD Changing from current controller to Symbicort Refill albuterol Doing well  # Hyperlipidemia, status post CABG Uncontrolled, previous LDL 139, patient has history of CABG Multiple statin intolerances I have asked her to make an appointment with her clinical pharmacist to consider Repatha  # Hypothyroidism Repeat TSH, symptomatically improved on 75 g Synthroid.    Orders Placed This Encounter  Procedures  . TSH    Meds ordered this encounter  Medications  . budesonide-formoterol (SYMBICORT) 160-4.5 MCG/ACT inhaler    Sig: Inhale 2 puffs into the lungs 2 (two) times daily.    Dispense:  1 Inhaler    Refill:  12  . albuterol (PROAIR HFA) 108 (90 Base) MCG/ACT inhaler    Sig: Inhale 2 puffs into the lungs every 6 (six) hours as needed for wheezing or shortness of breath.    Dispense:  36 g    Refill:  3    Any brand ok, 30 day supply, disp 2 inhalers    Laroy Apple, MD [facility] 12/24/2016, 3:05 PM

## 2016-12-24 NOTE — Patient Instructions (Signed)
Great to see you!  I have sent in albuterol as described, I have sent in Symbicort as a preventative medication.   Come back to Tammy, our pharmacist to consider repatha.

## 2016-12-25 ENCOUNTER — Encounter: Payer: Self-pay | Admitting: Family Medicine

## 2016-12-25 LAB — TSH: TSH: 1.96 u[IU]/mL (ref 0.450–4.500)

## 2016-12-31 ENCOUNTER — Telehealth: Payer: Self-pay | Admitting: Family Medicine

## 2016-12-31 ENCOUNTER — Encounter: Payer: Medicare Other | Admitting: Pharmacist

## 2016-12-31 NOTE — Telephone Encounter (Signed)
Pt aware of recommendations Will let us know how this goes

## 2016-12-31 NOTE — Telephone Encounter (Signed)
I generally recommend taking synthroid 30 minutes to 1 hour apart from other medications to imporve absorption. Please separate from other meds.     Laroy Apple, MD [facility] 12/31/2016, 12:08 PM

## 2016-12-31 NOTE — Telephone Encounter (Signed)
Patient states that when she takes her levothyroxine and lisinopril close together around 3:00pm everyday her vision becomes blurry. Patient wants to know if taking these medications together would be causing this? She also wants to know if she could change her medication to one in the morning and one at night would be ok?

## 2017-01-13 ENCOUNTER — Other Ambulatory Visit: Payer: Self-pay | Admitting: Family Medicine

## 2017-01-15 ENCOUNTER — Ambulatory Visit: Payer: Medicare Other | Admitting: Family Medicine

## 2017-01-21 ENCOUNTER — Encounter: Payer: Self-pay | Admitting: Family Medicine

## 2017-01-21 ENCOUNTER — Ambulatory Visit (INDEPENDENT_AMBULATORY_CARE_PROVIDER_SITE_OTHER): Payer: Medicare Other | Admitting: Family Medicine

## 2017-01-21 VITALS — BP 163/69 | HR 62 | Temp 97.9°F | Ht 61.0 in | Wt 99.4 lb

## 2017-01-21 DIAGNOSIS — J441 Chronic obstructive pulmonary disease with (acute) exacerbation: Secondary | ICD-10-CM

## 2017-01-21 MED ORDER — FLUTICASONE FUROATE-VILANTEROL 200-25 MCG/INH IN AEPB: 1.0000 | INHALATION_SPRAY | Freq: Every day | RESPIRATORY_TRACT | 11 refills | Status: DC

## 2017-01-21 NOTE — Progress Notes (Signed)
   HPI  Patient presents today here for hospital follow-up.  Patient states that she was to the emergency room 3 different occasions, the first for sore throat, she was treated for COPD exacerbation with prednisone. Then for UTI, then again for COPD. She was admitted and treated with nebulizers, oxygen, and more prednisone.  She states that prednisone caused severe shaking and feeling bad.  She is taking breo only as needed - instructed ootherwise  PMH: Smoking status noted ROS: Per HPI  Objective: BP (!) 163/69   Pulse 62   Temp 97.9 F (36.6 C) (Oral)   Ht 5\' 1"  (1.549 m)   Wt 99 lb 6.4 oz (45.1 kg)   BMI 18.78 kg/m  Gen: NAD, alert, cooperative with exam HEENT: NCAT, EOMI, PERRL CV: RRR, good S1/S2, no murmur Resp: CTABL, no wheezes, non-labored Ext: No edema, warm Neuro: Alert and oriented, No gross deficits  Assessment and plan:  # COPD exacerbation Patient recently hospitalized for COPD exacerbation Despite multiple explanations she has still not taking controller inhaler as prescribed, recommend 1 puff once daily. Albuterol as needed. Follow-up one month.    Meds ordered this encounter  Medications  . fluticasone furoate-vilanterol (BREO ELLIPTA) 200-25 MCG/INH AEPB    Sig: Inhale 1 puff into the lungs daily.    Laroy Apple, MD [facility] 01/21/2017, 1:23 PM

## 2017-01-21 NOTE — Patient Instructions (Signed)
Great to see you!  Take breo 1 puff every day to prevent breathing problems  Come back in 1 month for follow up of high blood pressure

## 2017-01-22 ENCOUNTER — Telehealth: Payer: Self-pay | Admitting: Family Medicine

## 2017-01-23 ENCOUNTER — Telehealth: Payer: Self-pay | Admitting: Family Medicine

## 2017-01-23 MED ORDER — ALBUTEROL SULFATE HFA 108 (90 BASE) MCG/ACT IN AERS: 2.0000 | INHALATION_SPRAY | RESPIRATORY_TRACT | 3 refills | Status: DC | PRN

## 2017-01-23 NOTE — Telephone Encounter (Signed)
Pt had 2 open calls regarding this. Will close encounter.

## 2017-01-23 NOTE — Telephone Encounter (Signed)
Spoke with pt and advised we sent over the new rx this morning. She will call the pharmacy for the new rx.

## 2017-01-23 NOTE — Telephone Encounter (Signed)
Rx Adjusted.   Laroy Apple, MD [facility] 01/23/2017, 7:40 AM

## 2017-02-18 ENCOUNTER — Telehealth: Payer: Self-pay | Admitting: Family Medicine

## 2017-02-18 NOTE — Telephone Encounter (Signed)
Faxed last labs to Dr Amanda Pea at [phone]

## 2017-02-21 ENCOUNTER — Other Ambulatory Visit: Payer: Self-pay | Admitting: Family Medicine

## 2017-03-24 ENCOUNTER — Encounter: Payer: Self-pay | Admitting: Family Medicine

## 2017-03-24 ENCOUNTER — Ambulatory Visit (INDEPENDENT_AMBULATORY_CARE_PROVIDER_SITE_OTHER): Payer: Medicare Other | Admitting: Family Medicine

## 2017-03-24 VITALS — BP 95/55 | HR 72 | Temp 96.8°F | Ht 61.0 in | Wt 95.0 lb

## 2017-03-24 DIAGNOSIS — R531 Weakness: Secondary | ICD-10-CM | POA: Diagnosis not present

## 2017-03-24 DIAGNOSIS — J42 Unspecified chronic bronchitis: Secondary | ICD-10-CM | POA: Diagnosis not present

## 2017-03-24 DIAGNOSIS — E039 Hypothyroidism, unspecified: Secondary | ICD-10-CM

## 2017-03-24 LAB — URINALYSIS, COMPLETE
Bilirubin, UA: NEGATIVE
Glucose, UA: NEGATIVE
LEUKOCYTES UA: NEGATIVE
Nitrite, UA: NEGATIVE
PH UA: 5 (ref 5.0–7.5)
Specific Gravity, UA: 1.02 (ref 1.005–1.030)
Urobilinogen, Ur: 1 mg/dL (ref 0.2–1.0)

## 2017-03-24 LAB — MICROSCOPIC EXAMINATION

## 2017-03-24 MED ORDER — LEVOTHYROXINE SODIUM 125 MCG PO TABS: 62.5000 ug | ORAL_TABLET | Freq: Every day | ORAL | 1 refills | Status: DC

## 2017-03-24 MED ORDER — DOXYCYCLINE HYCLATE 100 MG PO TABS: 100.0000 mg | ORAL_TABLET | Freq: Two times a day (BID) | ORAL | 0 refills | Status: DC

## 2017-03-24 NOTE — Progress Notes (Signed)
   HPI  Patient presents today here for hospital follow-up with weakness.  Patient states that she has recently seen a cardiologist and had a full workup, also she's been seen by pulmonology and told that she has emphysema and that not much can be done for her.  Patient states that for about 2 months she's had episodes of shaking, weakness, and chills. The weakness is persistent.  She states that she's been seen in the emergency room 4 times and over the weekend she was seen in another emergency room, in [facility].  She states that the doctor from [facility] called yesterday and stated that she had an infection and that she needed to see her PCP. She is not sure where the infection was.  No dysuria, no fever, or difficulty tolerating PO.   Chills and sweats  PMH: Smoking status noted ROS: Per HPI  Objective: BP (!) 95/55   Pulse 72   Temp (!) 96.8 F (36 C) (Oral)   Ht 5\' 1"  (1.549 m)   Wt 95 lb (43.1 kg)   SpO2 93%   BMI 17.95 kg/m  Gen: NAD, alert, cooperative with exam HEENT: NCAT,  CV: RRR, good S1/S2 Resp: CTABL, no wheezes, non-labored Abd: SNTND, BS present, no guarding or organomegaly, no suprapubic tenderness, no CVA tenderness Ext: No edema, warm Neuro: Alert and oriented, No gross deficits  Assessment and plan:  # Weakness Patient with generalized weakness with episodes of shaking and chills. Emergency room labs were reviewed with mildly elevated lactate, however no anion gap acidosis, her bicarbonate was 20, however her gap was 12. Her TSH was found to be 0.48, similar. Her PCO2 was 30.2, PaO2 was 57.7-consistent with emphysema with hyperventilation Covering broadly for infection with doxycycline, urine culture pending, urinalysis looks reassuring  #COPD At baseline now No changes Requesting records from pulmonology in [place]  # Hypothyroidism Changing Synthroid to 62.5 g, patient will 1:30 125 g tablet Repeat TSH in about 6  weeks     Orders Placed This Encounter  Procedures  . Urinalysis, Complete    No orders of the defined types were placed in this encounter.   Laroy Apple, MD [facility] 03/24/2017, 12:25 PM

## 2017-03-24 NOTE — Patient Instructions (Signed)
Great to see you!  Lets follow up in about 6 weeks

## 2017-03-25 LAB — CMP14+EGFR
ALK PHOS: 74 IU/L (ref 39–117)
ALT: 19 IU/L (ref 0–32)
AST: 30 IU/L (ref 0–40)
Albumin/Globulin Ratio: 1.6 (ref 1.2–2.2)
Albumin: 4.1 g/dL (ref 3.5–4.7)
BILIRUBIN TOTAL: 0.3 mg/dL (ref 0.0–1.2)
BUN / CREAT RATIO: 20 (ref 12–28)
BUN: 30 mg/dL — ABNORMAL HIGH (ref 8–27)
CHLORIDE: 101 mmol/L (ref 96–106)
CO2: 21 mmol/L (ref 20–29)
CREATININE: 1.5 mg/dL — AB (ref 0.57–1.00)
Calcium: 9.3 mg/dL (ref 8.7–10.3)
GFR calc Af Amer: 37 mL/min/{1.73_m2} — ABNORMAL LOW (ref >59)
GFR calc non Af Amer: 32 mL/min/{1.73_m2} — ABNORMAL LOW (ref >59)
GLUCOSE: 100 mg/dL — AB (ref 65–99)
Globulin, Total: 2.5 g/dL (ref 1.5–4.5)
Potassium: 4 mmol/L (ref 3.5–5.2)
SODIUM: 141 mmol/L (ref 134–144)
Total Protein: 6.6 g/dL (ref 6.0–8.5)

## 2017-03-25 LAB — LACTIC ACID, PLASMA: LACTATE: 11.5 mg/dL (ref 4.8–25.7)

## 2017-03-26 LAB — URINE CULTURE

## 2017-04-03 ENCOUNTER — Encounter: Payer: Self-pay | Admitting: Family Medicine

## 2017-04-03 ENCOUNTER — Ambulatory Visit (INDEPENDENT_AMBULATORY_CARE_PROVIDER_SITE_OTHER): Payer: Medicare Other | Admitting: Family Medicine

## 2017-04-03 VITALS — BP 139/67 | HR 57 | Temp 96.8°F | Ht 61.0 in | Wt 95.8 lb

## 2017-04-03 DIAGNOSIS — R7989 Other specified abnormal findings of blood chemistry: Secondary | ICD-10-CM

## 2017-04-03 NOTE — Patient Instructions (Signed)
Great to see you!  Come back in 3 months 

## 2017-04-03 NOTE — Progress Notes (Signed)
   HPI  Patient presents today here for follow-up.  Patient was seen about 10 days ago for weakness and fatigue. She had been evaluated in the emergency room with not much improvement.  On labs she had elevated creatinine. She states that she is making normal urine output. She had insignificant growth of Klebsiella in the urine. She was treated empirically with doxycycline with vague symptoms last visit.  She states that she tolerated doxycycline well and feels much better. She feels like she's back to baseline. Tolerating food and fluids like usual. She reports baseline respirations. Energy is returning to normal  PMH: Smoking status noted ROS: Per HPI  Objective: BP 139/67   Pulse (!) 57   Temp (!) 96.8 F (36 C) (Oral)   Ht '5\' 1"'$  (1.549 m)   Wt 95 lb 12.8 oz (43.5 kg)   BMI 18.10 kg/m  Gen: NAD, alert, cooperative with exam HEENT: NCAT CV: RRR, good S1/S2, no murmur Resp: CTABL, no wheezes, non-labored Abd: SNTND, BS present, no guarding or organomegaly Ext: No edema, warm Neuro: Alert and oriented, No gross deficits  Assessment and plan:  # Elevated serum creatinine Repeat labs Patient treatment doxycycline last visit after she had slightly elevated lactate in the ER and symptoms of UTI. She has improved, hopefully her creatinine has as well     Orders Placed This Encounter  Procedures  . Garner, MD [facility] 04/03/2017, 2:03 PM

## 2017-04-04 LAB — BMP8+EGFR
BUN / CREAT RATIO: 28 (ref 12–28)
BUN: 27 mg/dL (ref 8–27)
CO2: 22 mmol/L (ref 20–29)
CREATININE: 0.97 mg/dL (ref 0.57–1.00)
Calcium: 9.7 mg/dL (ref 8.7–10.3)
Chloride: 103 mmol/L (ref 96–106)
GFR calc Af Amer: 63 mL/min/{1.73_m2} (ref >59)
GFR calc non Af Amer: 55 mL/min/{1.73_m2} — ABNORMAL LOW (ref >59)
GLUCOSE: 83 mg/dL (ref 65–99)
POTASSIUM: 4.2 mmol/L (ref 3.5–5.2)
SODIUM: 140 mmol/L (ref 134–144)

## 2017-04-21 ENCOUNTER — Other Ambulatory Visit: Payer: Self-pay | Admitting: Family Medicine

## 2017-05-06 ENCOUNTER — Ambulatory Visit (INDEPENDENT_AMBULATORY_CARE_PROVIDER_SITE_OTHER): Payer: Medicare Other | Admitting: Family Medicine

## 2017-05-06 ENCOUNTER — Encounter: Payer: Self-pay | Admitting: Family Medicine

## 2017-05-06 ENCOUNTER — Encounter: Payer: Self-pay | Admitting: *Deleted

## 2017-05-06 ENCOUNTER — Telehealth: Payer: Self-pay | Admitting: Family Medicine

## 2017-05-06 VITALS — BP 126/57 | HR 64 | Temp 98.7°F | Ht 61.0 in | Wt 96.6 lb

## 2017-05-06 DIAGNOSIS — J42 Unspecified chronic bronchitis: Secondary | ICD-10-CM

## 2017-05-06 DIAGNOSIS — R059 Cough, unspecified: Secondary | ICD-10-CM

## 2017-05-06 DIAGNOSIS — R05 Cough: Secondary | ICD-10-CM

## 2017-05-06 DIAGNOSIS — N390 Urinary tract infection, site not specified: Secondary | ICD-10-CM

## 2017-05-06 LAB — VERITOR FLU A/B WAIVED
Influenza A: NEGATIVE
Influenza B: NEGATIVE

## 2017-05-06 MED ORDER — AZITHROMYCIN 250 MG PO TABS
ORAL_TABLET | ORAL | 0 refills | Status: DC
Start: 2017-05-06 — End: 2017-06-04

## 2017-05-06 NOTE — Progress Notes (Signed)
   HPI  Patient presents today here with cough and for recheck of urinalysis.  Patient with previous dysuria, urine culture was not significant. She was treated at that time.  Her creatinine which had improved on repeat testing.  Cough and shortness of breath worsening for about 2 days. No fevers, Chills, sweats. She is tolerating food and fluids like usual She reports medication compliance per usual.  PMH: Smoking status noted ROS: Per HPI  Objective: BP (!) 126/57   Pulse 64   Temp 98.7 F (37.1 C) (Oral)   Ht 5\' 1"  (1.549 m)   Wt 96 lb 9.6 oz (43.8 kg)   SpO2 96%   BMI 18.25 kg/m  Gen: NAD, alert, cooperative with exam HEENT: NCAT, oropharynx moist and clear, nares clear, TMs normal bilaterally CV: RRR, good S1/S2, no murmur Resp: CTABL, no wheezes, non-labored Ext: No edema, warm Neuro: Alert and oriented, No gross deficits  Assessment and plan:  #COPD, cough, COPD exacerbation Treating with azithromycin, patient would like to avoid glucocorticoids. Continue albuterol as prescribed  #Recurrent UTI Previous urine culture with insignificant growth to indicate UTI. Previous hyaline cast, now granular cast, continue to monitor    Orders Placed This Encounter  Procedures  . Urine Culture  . Urinalysis, Complete  . Veritor Flu A/B Waived    Order Specific Question:   Source    Answer:   nasal    Meds ordered this encounter  Medications  . azithromycin (ZITHROMAX) 250 MG tablet    Sig: Take 2 tablets on day 1 and 1 tablet daily after that    Dispense:  6 tablet    Refill:  0    Laroy Apple, MD [facility] 05/06/2017, 4:02 PM

## 2017-05-06 NOTE — Telephone Encounter (Signed)
Patient completed antibiotic for UTI she was given in September.  Feels she was supposed to come back and follow up.  She also complains of congestion, drainage and cough.  Appointment made today at 3:25 pm

## 2017-05-06 NOTE — Patient Instructions (Signed)
Great to see you!   

## 2017-05-07 ENCOUNTER — Ambulatory Visit: Payer: Medicare Other | Admitting: Family Medicine

## 2017-05-07 LAB — URINE CULTURE

## 2017-05-08 LAB — URINALYSIS, COMPLETE
Bilirubin, UA: NEGATIVE
GLUCOSE, UA: NEGATIVE
Ketones, UA: NEGATIVE
LEUKOCYTES UA: NEGATIVE
Nitrite, UA: NEGATIVE
Protein, UA: NEGATIVE
RBC, UA: NEGATIVE
SPEC GRAV UA: 1.015 (ref 1.005–1.030)
Urobilinogen, Ur: 0.2 mg/dL (ref 0.2–1.0)
pH, UA: 5.5 (ref 5.0–7.5)

## 2017-05-08 LAB — MICROSCOPIC EXAMINATION

## 2017-06-04 ENCOUNTER — Encounter: Payer: Self-pay | Admitting: Family Medicine

## 2017-06-04 ENCOUNTER — Ambulatory Visit (INDEPENDENT_AMBULATORY_CARE_PROVIDER_SITE_OTHER): Payer: Medicare Other | Admitting: Family Medicine

## 2017-06-04 VITALS — BP 134/56 | HR 51 | Temp 96.9°F | Ht 61.0 in | Wt 101.0 lb

## 2017-06-04 DIAGNOSIS — J42 Unspecified chronic bronchitis: Secondary | ICD-10-CM

## 2017-06-04 DIAGNOSIS — I10 Essential (primary) hypertension: Secondary | ICD-10-CM

## 2017-06-04 MED ORDER — METOPROLOL SUCCINATE ER 25 MG PO TB24: 25.0000 mg | ORAL_TABLET | Freq: Every day | ORAL | Status: DC

## 2017-06-04 NOTE — Patient Instructions (Signed)
Great to see you!  Come back in 4 months unless you need us sooner.    

## 2017-06-04 NOTE — Progress Notes (Signed)
   HPI  Patient presents today here to follow-up for chronic medical conditions.  Hypertension Good medication compliance, she is still taking metoprolol No dizziness, headache, chest pain  COPD Patient is following closely with pulmonology now, she is trying different inhalers and is now on Anoro She feels like she is breathing better in general.  She is still uses albuterol as needed. She states that sometimes she uses a little bit of moonshine to help her breathe  PMH: Smoking status noted ROS: Per HPI  Objective: BP (!) 134/56   Pulse (!) 51   Temp (!) 96.9 F (36.1 C) (Oral)   Ht 5\' 1"  (1.549 m)   Wt 101 lb (45.8 kg)   BMI 19.08 kg/m  Gen: NAD, alert, cooperative with exam HEENT: NCAT CV: RRR, good S1/S2, no murmur Resp: CTABL, no wheezes, non-labored Ext: No edema, warm Neuro: Alert and oriented, No gross deficits  Assessment and plan:  #Hypertension Well-controlled on current medications Continue Toprol, Prinzide. Heart rate is low today, however it is likely beta-blocker induced, with no side effects I will continue current doses  #COPD Following with pulmonology, improving No changes   Meds ordered this encounter  Medications  . metoprolol succinate (TOPROL-XL) 25 MG 24 hr tablet    Sig: Take 1 tablet (25 mg total) by mouth daily.    Laroy Apple, MD [facility] 06/04/2017, 1:23 PM

## 2017-06-05 ENCOUNTER — Ambulatory Visit: Payer: Medicare Other | Admitting: Family Medicine

## 2017-06-24 ENCOUNTER — Telehealth: Payer: Self-pay | Admitting: Family Medicine

## 2017-06-24 NOTE — Telephone Encounter (Signed)
Patient states that the pharmacy did not have the same manufacture of  levothyroxine that she has been getting. They ran out and had to switch to another manufacture for the month - states that it should be switched back next month. Patient states that she has been taking it for 2 days and a few hours after she takes it she gets diarrhea and has nausea. Please advise.

## 2017-06-24 NOTE — Telephone Encounter (Signed)
Patient states she wants to try to see if symptoms get better and then will let us know.

## 2017-06-24 NOTE — Telephone Encounter (Signed)
It is ceartainly reasonable for her to try a different brand or to get brand-name synthroid which is typically about 1 dollar a pill.   Ok to send new rx to another pharmacy if needed.   Laroy Apple, MD [facility] 06/24/2017, 3:00 PM

## 2017-07-09 DIAGNOSIS — H33022 Retinal detachment with multiple breaks, left eye: Secondary | ICD-10-CM | POA: Insufficient documentation

## 2017-07-12 DIAGNOSIS — H35342 Macular cyst, hole, or pseudohole, left eye: Secondary | ICD-10-CM | POA: Insufficient documentation

## 2017-07-14 ENCOUNTER — Telehealth: Payer: Self-pay | Admitting: Family Medicine

## 2017-07-14 MED ORDER — AZITHROMYCIN 250 MG PO TABS: ORAL_TABLET | ORAL | 0 refills | Status: DC

## 2017-07-14 NOTE — Telephone Encounter (Signed)
What symptoms do you have? COPD, choked up with cold having hard time breathing   How long have you been sick? Since 07/01/17 with the coughing  Have you been seen for this problem? No   If your provider decides to give you a prescription, which pharmacy would you like for it to be sent to? [facility] [address], [place] wants zpak sent in, was offered appt for tomorrow says she cant come in she had eye surgery   Patient informed that this information will be sent to the clinical staff for review and that they should receive a follow up call.

## 2017-07-14 NOTE — Telephone Encounter (Signed)
Patient aware.

## 2017-07-14 NOTE — Telephone Encounter (Signed)
Ok with Zpack for COPD exac. Pt has eye appt tomorrow so cant come in.   Laroy Apple, MD [facility] 07/14/2017, 1:37 PM

## 2017-07-16 ENCOUNTER — Telehealth: Payer: Self-pay | Admitting: Family Medicine

## 2017-07-16 NOTE — Telephone Encounter (Signed)
Forwarding, just read at end of day while covering urgent. Can wait for Dr. Wendi Snipes.

## 2017-07-16 NOTE — Telephone Encounter (Signed)
Patent is requesting a sample of breo- states she has used it before and it helps her. Her cold has improved but she wanted a inhaler on hand due to the bad weather coming in. Not on med list- Covering PCP- please advise.

## 2017-07-17 MED ORDER — FLUTICASONE FUROATE 200 MCG/ACT IN AEPB: 1.0000 | INHALATION_SPRAY | Freq: Every day | RESPIRATORY_TRACT | 11 refills | Status: DC

## 2017-07-17 NOTE — Telephone Encounter (Signed)
Ok with breo sample, however ithere is overlap with anoro.   If she uses breo she should not use anoro. I would rather send her an Rx of Arnuity which adds the missing drug in the combination.   Sent to [facility] in [place].   Laroy Apple, MD [facility] 07/17/2017, 7:38 AM

## 2017-07-19 ENCOUNTER — Other Ambulatory Visit: Payer: Self-pay | Admitting: Family Medicine

## 2017-07-28 NOTE — Telephone Encounter (Signed)
Pt hasn't called back and has been greater than 3 days, will close encounter.

## 2017-08-19 ENCOUNTER — Other Ambulatory Visit: Payer: Self-pay | Admitting: Family Medicine

## 2017-08-26 ENCOUNTER — Telehealth: Payer: Self-pay | Admitting: Family Medicine

## 2017-09-05 ENCOUNTER — Ambulatory Visit (INDEPENDENT_AMBULATORY_CARE_PROVIDER_SITE_OTHER): Payer: Medicare Other | Admitting: Family Medicine

## 2017-09-05 ENCOUNTER — Encounter: Payer: Self-pay | Admitting: Family Medicine

## 2017-09-05 VITALS — BP 175/77 | HR 65 | Temp 96.5°F | Ht 61.0 in | Wt 93.2 lb

## 2017-09-05 DIAGNOSIS — I10 Essential (primary) hypertension: Secondary | ICD-10-CM

## 2017-09-05 DIAGNOSIS — J42 Unspecified chronic bronchitis: Secondary | ICD-10-CM | POA: Diagnosis not present

## 2017-09-05 NOTE — Progress Notes (Signed)
   HPI  Patient presents today here for follow-up chronic medical conditions.  Hypertension Good medication compliance Patient states that she is very nervous today because she has had recent eye surgery and had a hard time getting here today due to the rain.  COPD Doing very well with her current inhaler, she reports using BRE O, however she has been prescribed trellegy- she states that she rarely uses a controller now.    PMH: Smoking status noted ROS: Per HPI  Objective: BP (!) 175/77   Pulse 65   Temp (!) 96.5 F (35.8 C) (Oral)   Ht 5\' 1"  (1.549 m)   Wt 93 lb 3.2 oz (42.3 kg)   BMI 17.61 kg/m  Gen: NAD, alert, cooperative with exam HEENT: NCAT, left eye with dilated pupil CV: RRR, good S1/S2, no murmur Resp: CTABL, no wheezes, non-labored Ext: No edema, warm Neuro: Alert and oriented, No gross deficits  Assessment and plan:  #Hypertension Elevated today, however patient states that she is nervous and feels this is why. Reports good medication compliance, historically she has had well-controlled blood pressure  #COPD Well-controlled, will investigate which inhaler she is currently filling with her pharmacy No Changes planned    Laroy Apple, MD [facility] 09/05/2017, 2:57 PM

## 2017-11-06 ENCOUNTER — Encounter: Payer: Self-pay | Admitting: Family Medicine

## 2017-11-06 ENCOUNTER — Ambulatory Visit (INDEPENDENT_AMBULATORY_CARE_PROVIDER_SITE_OTHER): Payer: Medicare Other | Admitting: Family Medicine

## 2017-11-06 VITALS — BP 130/63 | HR 59 | Temp 97.6°F | Ht 61.0 in | Wt 91.0 lb

## 2017-11-06 DIAGNOSIS — R197 Diarrhea, unspecified: Secondary | ICD-10-CM | POA: Diagnosis not present

## 2017-11-06 DIAGNOSIS — R399 Unspecified symptoms and signs involving the genitourinary system: Secondary | ICD-10-CM

## 2017-11-06 LAB — MICROSCOPIC EXAMINATION: Renal Epithel, UA: NONE SEEN /hpf

## 2017-11-06 LAB — URINALYSIS, COMPLETE
BILIRUBIN UA: NEGATIVE
GLUCOSE, UA: NEGATIVE
KETONES UA: NEGATIVE
Leukocytes, UA: NEGATIVE
NITRITE UA: NEGATIVE
Protein, UA: NEGATIVE
RBC UA: NEGATIVE
SPEC GRAV UA: 1.015 (ref 1.005–1.030)
UUROB: 0.2 mg/dL (ref 0.2–1.0)
pH, UA: 5 (ref 5.0–7.5)

## 2017-11-06 NOTE — Patient Instructions (Addendum)
You had labs performed today.  You will be contacted with the results of the labs once they are available, usually in the next 3 days for routine lab work.  Your urinalysis did not show any infection today.  I will send this for urine culture just to make sure we will prescribe an antibiotic pending the urine culture.  Please schedule follow-up visit with Dr. Wendi Snipes for your bowel movements to be seen in the next week or two.  Avoid fatty/ greasy foods   Food Choices to Help Relieve Diarrhea, Adult When you have diarrhea, the foods you eat and your eating habits are very important. Choosing the right foods and drinks can help:  Relieve diarrhea.  Replace lost fluids and nutrients.  Prevent dehydration.  What general guidelines should I follow? Relieving diarrhea  Choose foods with less than 2 g or .07 oz. of fiber per serving.  Limit fats to less than 8 tsp (38 g or 1.34 oz.) a day.  Avoid the following: ? Foods and beverages sweetened with high-fructose corn syrup, honey, or sugar alcohols such as xylitol, sorbitol, and mannitol. ? Foods that contain a lot of fat or sugar. ? Fried, greasy, or spicy foods. ? High-fiber grains, breads, and cereals. ? Raw fruits and vegetables.  Eat foods that are rich in probiotics. These foods include dairy products such as yogurt and fermented milk products. They help increase healthy bacteria in the stomach and intestines (gastrointestinal tract, or GI tract).  If you have lactose intolerance, avoid dairy products. These may make your diarrhea worse.  Take medicine to help stop diarrhea (antidiarrheal medicine) only as told by your health care provider. Replacing nutrients  Eat small meals or snacks every 3-4 hours.  Eat bland foods, such as white rice, toast, or baked potato, until your diarrhea starts to get better. Gradually reintroduce nutrient-rich foods as tolerated or as told by your health care provider. This  includes: ? Well-cooked protein foods. ? Peeled, seeded, and soft-cooked fruits and vegetables. ? Low-fat dairy products.  Take vitamin and mineral supplements as told by your health care provider. Preventing dehydration   Start by sipping water or a special solution to prevent dehydration (oral rehydration solution, ORS). Urine that is clear or pale yellow means that you are getting enough fluid.  Try to drink at least 8-10 cups of fluid each day to help replace lost fluids.  You may add other liquids in addition to water, such as clear juice or decaffeinated sports drinks, as tolerated or as told by your health care provider.  Avoid drinks with caffeine, such as coffee, tea, or soft drinks.  Avoid alcohol. What foods are recommended? The items listed may not be a complete list. Talk with your health care provider about what dietary choices are best for you. Grains White rice. White, [place], or pita breads (fresh or toasted), including plain rolls, buns, or bagels. White pasta. Saltine, soda, or graham crackers. Pretzels. Low-fiber cereal. Cooked cereals made with water (such as cornmeal, farina, or cream cereals). Plain muffins. Matzo. Melba toast. Zwieback. Vegetables Potatoes (without the skin). Most well-cooked and canned vegetables without skins or seeds. Tender lettuce. Fruits Apple sauce. Fruits canned in juice. Cooked apricots, cherries, grapefruit, peaches, pears, or plums. Fresh bananas and cantaloupe. Meats and other protein foods Baked or boiled chicken. Eggs. Tofu. Fish. Seafood. Smooth nut butters. Ground or well-cooked tender beef, ham, veal, lamb, pork, or poultry. Dairy Plain yogurt, kefir, and unsweetened liquid yogurt. Lactose-free milk, buttermilk,  skim milk, or soy milk. Low-fat or nonfat hard cheese. Beverages Water. Low-calorie sports drinks. Fruit juices without pulp. Strained tomato and vegetable juices. Decaffeinated teas. Sugar-free beverages not sweetened  with sugar alcohols. Oral rehydration solutions, if approved by your health care provider. Seasoning and other foods Bouillon, broth, or soups made from recommended foods. What foods are not recommended? The items listed may not be a complete list. Talk with your health care provider about what dietary choices are best for you. Grains Whole grain, whole wheat, bran, or rye breads, rolls, pastas, and crackers. Wild or brown rice. Whole grain or bran cereals. Barley. Oats and oatmeal. Corn tortillas or taco shells. Granola. Popcorn. Vegetables Raw vegetables. Fried vegetables. Cabbage, broccoli, Brussels sprouts, artichokes, baked beans, beet greens, corn, kale, legumes, peas, sweet potatoes, and yams. Potato skins. Cooked spinach and cabbage. Fruits Dried fruit, including raisins and dates. Raw fruits. Stewed or dried prunes. Canned fruits with syrup. Meat and other protein foods Fried or fatty meats. Deli meats. Chunky nut butters. Nuts and seeds. Beans and lentils. Berniece Salines. Hot dogs. Sausage. Dairy High-fat cheeses. Whole milk, chocolate milk, and beverages made with milk, such as milk shakes. Half-and-half. Cream. sour cream. Ice cream. Beverages Caffeinated beverages (such as coffee, tea, soda, or energy drinks). Alcoholic beverages. Fruit juices with pulp. Prune juice. Soft drinks sweetened with high-fructose corn syrup or sugar alcohols. High-calorie sports drinks. Fats and oils Butter. Cream sauces. Margarine. Salad oils. Plain salad dressings. Olives. Avocados. Mayonnaise. Sweets and desserts Sweet rolls, doughnuts, and sweet breads. Sugar-free desserts sweetened with sugar alcohols such as xylitol and sorbitol. Seasoning and other foods Honey. Hot sauce. Chili powder. Gravy. Cream-based or milk-based soups. Pancakes and waffles. Summary  When you have diarrhea, the foods you eat and your eating habits are very important.  Make sure you get at least 8-10 cups of fluid each day, or  enough to keep your urine clear or pale yellow.  Eat bland foods and gradually reintroduce healthy, nutrient-rich foods as tolerated, or as told by your health care provider.  Avoid high-fiber, fried, greasy, or spicy foods. This information is not intended to replace advice given to you by your health care provider. Make sure you discuss any questions you have with your health care provider. Document Released: 09/14/2003 Document Revised: 06/21/2016 Document Reviewed: 06/21/2016 Elsevier Interactive Patient Education  [employer].

## 2017-11-06 NOTE — Progress Notes (Signed)
Subjective: CC: UTI PCP: Timmothy Euler, MD [id number] [name] is a 84 Carla.o. female presenting to clinic today for:  1. Urinary symptoms Patient reports a 3 day h/o increased urinary frequency.  Denies urinary urgency, hematuria, fevers, chills, abdominal pain, nausea, vomiting, back pain, vaginal discharge.  Patient has used nothing for symptoms.  Patient denies a h/o frequent or recurrent UTIs.    2. Chronic Diarrhea Patient reports that she has had a chronic issue with diarrhea.  She notes that she can have up to 15 loose stools a day.  She reports that this is been ongoing for the last several years since her gallbladder surgery.  She notes that she frequently eats out and typically consumes a lot of butter and fatty foods.  She reports that she saw her gallbladder surgeon and was referred to her PCP for further evaluation.  Denies any abdominal pain but does feel like she has been losing weight because she has chronic diarrhea.  She has been taking Imodium for the last 2 days, which she does report has improved her symptoms some.  No known undercooked foods, feels left out too long.   ROS: Per HPI  Allergies  Allergen Reactions  . Codeine     REACTION: rashes swelling  . Ciprofloxacin Rash   Past Medical History:  Diagnosis Date  . COPD, severe    Question history of asthma  . Coronary artery disease    NSTEMI/high grade left main disease  . Dyslipidemia   . Hypertension   . Hypothyroidism   . Peripheral vascular disease (HCC)    50% left RAS  . RBBB (right bundle branch block)   . Status post coronary artery bypass grafting    status post cabbage 2008    Current Outpatient Medications:  .  albuterol (PROAIR HFA) 108 (90 Base) MCG/ACT inhaler, Inhale 2 puffs into the lungs every 4 (four) hours as needed for wheezing or shortness of breath., Disp: 36 g, Rfl: 3 .  Fluticasone Furoate (ARNUITY ELLIPTA) 200 MCG/ACT AEPB, Inhale 1 puff into the lungs daily., Disp: 30  each, Rfl: 11 .  levothyroxine (SYNTHROID, LEVOTHROID) 125 MCG tablet, TAKE 1/2 TABLET BY MOUTH DAILY, Disp: 45 tablet, Rfl: 0 .  lisinopril-hydrochlorothiazide (PRINZIDE,ZESTORETIC) 20-12.5 MG tablet, TAKE 1 TABLET BY MOUTH EVERY DAY, Disp: 90 tablet, Rfl: 1 .  metoprolol succinate (TOPROL-XL) 25 MG 24 hr tablet, Take 1 tablet (25 mg total) by mouth daily., Disp: , Rfl:  .  umeclidinium-vilanterol (ANORO ELLIPTA) 62.5-25 MCG/INH AEPB, Inhale 1 puff into the lungs daily., Disp: , Rfl:  Social History   Socioeconomic History  . Marital status: Divorced    Spouse name: Not on file  . Number of children: Not on file  . Years of education: Not on file  . Highest education level: Not on file  Occupational History  . Not on file  Social Needs  . Financial resource strain: Not on file  . Food insecurity:    Worry: Not on file    Inability: Not on file  . Transportation needs:    Medical: Not on file    Non-medical: Not on file  Tobacco Use  . Smoking status: Former Smoker    Packs/day: 0.80    Years: 55.00    Pack years: 44.00    Types: Cigarettes    Last attempt to quit: 07/08/2006    Years since quitting: 11.3  . Smokeless tobacco: Never Used  Substance and Sexual Activity  .  Alcohol use: No  . Drug use: No  . Sexual activity: Not on file  Lifestyle  . Physical activity:    Days per week: Not on file    Minutes per session: Not on file  . Stress: Not on file  Relationships  . Social connections:    Talks on phone: Not on file    Gets together: Not on file    Attends religious service: Not on file    Active member of club or organization: Not on file    Attends meetings of clubs or organizations: Not on file    Relationship status: Not on file  . Intimate partner violence:    Fear of current or ex partner: Not on file    Emotionally abused: Not on file    Physically abused: Not on file    Forced sexual activity: Not on file  Other Topics Concern  . Not on file  Social  History Narrative  . Not on file   No family history on file.  Objective: Office vital signs reviewed. BP 130/63   Pulse (!) 59   Temp 97.6 F (36.4 C) (Oral)   Ht 5\' 1"  (1.549 m)   Wt 91 lb (41.3 kg)   BMI 17.19 kg/m   Physical Examination:  General: Awake, alert, thin, chronically ill appearing female, No acute distress GU: No suprapubic tenderness.   Assessment/ Plan: 84 Carla.o. female   1. Urinary tract infection symptoms Urine dip did not demonstrate any evidence of infection.  Patient is afebrile and well-appearing on today's exam.  I have sent for urine culture to make sure that this is not a false negative dip.  Will contact patient with results of culture once this is available and prescribe antibiotics pending this result. - Urinalysis, Complete - Urine Culture  2. Diarrhea, unspecified type ` Chronic issue for patient.  Likely due to cholecystectomy and continued consumption of fatty foods.  I provided her a handout with dietary instructions to reduce diarrhea.  Will check BMP and CBC.  I recommended that she follow the diet, push oral fluids and follow-up with her PCP in the next 1 to 2 weeks for recheck.  May consider referral to gastroenterology for further evaluation. - Basic Metabolic Panel - CBC with Differential   Orders Placed This Encounter  Procedures  . Urine Culture  . Urinalysis, Complete  . Basic Metabolic Panel  . CBC with Differential      [name], [place] [phone]

## 2017-11-07 LAB — CBC WITH DIFFERENTIAL/PLATELET
Basophils Absolute: 0 10*3/uL (ref 0.0–0.2)
Basos: 0 %
EOS (ABSOLUTE): 0.1 10*3/uL (ref 0.0–0.4)
EOS: 2 %
HEMATOCRIT: 35.5 % (ref 34.0–46.6)
HEMOGLOBIN: 11.3 g/dL (ref 11.1–15.9)
Immature Grans (Abs): 0 10*3/uL (ref 0.0–0.1)
Immature Granulocytes: 0 %
LYMPHS ABS: 1.6 10*3/uL (ref 0.7–3.1)
Lymphs: 24 %
MCH: 27.2 pg (ref 26.6–33.0)
MCHC: 31.8 g/dL (ref 31.5–35.7)
MCV: 86 fL (ref 79–97)
MONOCYTES: 4 %
Monocytes Absolute: 0.3 10*3/uL (ref 0.1–0.9)
Neutrophils Absolute: 4.8 10*3/uL (ref 1.4–7.0)
Neutrophils: 70 %
Platelets: 320 10*3/uL (ref 150–379)
RBC: 4.15 x10E6/uL (ref 3.77–5.28)
RDW: 15.1 % (ref 12.3–15.4)
WBC: 6.8 10*3/uL (ref 3.4–10.8)

## 2017-11-07 LAB — BASIC METABOLIC PANEL
BUN / CREAT RATIO: 18 (ref 12–28)
BUN: 19 mg/dL (ref 8–27)
CHLORIDE: 106 mmol/L (ref 96–106)
CO2: 23 mmol/L (ref 20–29)
CREATININE: 1.04 mg/dL — AB (ref 0.57–1.00)
Calcium: 9.1 mg/dL (ref 8.7–10.3)
GFR calc Af Amer: 57 mL/min/{1.73_m2} — ABNORMAL LOW (ref >59)
GFR calc non Af Amer: 50 mL/min/{1.73_m2} — ABNORMAL LOW (ref >59)
Glucose: 89 mg/dL (ref 65–99)
Potassium: 4 mmol/L (ref 3.5–5.2)
Sodium: 144 mmol/L (ref 134–144)

## 2017-11-07 LAB — URINE CULTURE

## 2017-11-14 ENCOUNTER — Ambulatory Visit: Payer: Medicare Other | Admitting: Family Medicine

## 2017-11-17 ENCOUNTER — Other Ambulatory Visit: Payer: Self-pay | Admitting: Family Medicine

## 2017-11-17 NOTE — Telephone Encounter (Signed)
Last thyroid 12/24/16  Dr Wendi Snipes

## 2017-12-08 ENCOUNTER — Encounter: Payer: Self-pay | Admitting: Family Medicine

## 2017-12-08 ENCOUNTER — Ambulatory Visit (INDEPENDENT_AMBULATORY_CARE_PROVIDER_SITE_OTHER): Payer: Medicare Other | Admitting: Family Medicine

## 2017-12-08 VITALS — BP 144/64 | HR 68 | Temp 97.8°F | Ht 61.0 in | Wt 91.0 lb

## 2017-12-08 DIAGNOSIS — E030 Congenital hypothyroidism with diffuse goiter: Secondary | ICD-10-CM | POA: Diagnosis not present

## 2017-12-08 DIAGNOSIS — I10 Essential (primary) hypertension: Secondary | ICD-10-CM | POA: Diagnosis not present

## 2017-12-08 MED ORDER — LEVOTHYROXINE SODIUM 125 MCG PO TABS: 62.5000 ug | ORAL_TABLET | Freq: Every day | ORAL | 0 refills | Status: DC

## 2017-12-08 NOTE — Progress Notes (Signed)
   HPI  Patient presents today here for follow-up hypertension.  Patient feels well and has no complaints. She states that they recently changed the brand of her medication and that she has had a little bit less controlled blood pressure since that time.  She is been struggling with retinal detachment being treated by ophthalmology.  She states that when her blood pressure is in the 140s she can feel little blurred vision that improves after he goes back down.  Patient states she has not taken her medication today.  She is breathing well and has good medication compliance.  She would like to defer any labs today and have them done next visit.  PMH: Smoking status noted ROS: Per HPI  Objective: BP (!) 144/64   Pulse 68   Temp 97.8 F (36.6 C) (Oral)   Ht 5\' 1"  (1.549 m)   Wt 91 lb (41.3 kg)   BMI 17.19 kg/m  Gen: NAD, alert, cooperative with exam HEENT: NCAT CV: RRR, good S1/S2, no murmur Resp: CTABL, no wheezes, non-labored Ext: No edema, warm Neuro: Alert and oriented, No gross deficits  Assessment and plan:  #HTN Slightly elevated today, however normal considering her age. Continue Current medications  # hypothyroidism Pt is due for TSH next month but would like to defer labs- TSH next moth Asymptomatic, no change.     Laroy Apple, MD [facility] 12/08/2017, 3:28 PM

## 2017-12-08 NOTE — Patient Instructions (Signed)
Great to see you!  Come back to see Dr. Gottschalk in 3 months 

## 2017-12-12 ENCOUNTER — Ambulatory Visit (INDEPENDENT_AMBULATORY_CARE_PROVIDER_SITE_OTHER): Payer: Medicare Other | Admitting: Family Medicine

## 2017-12-12 VITALS — BP 121/62 | HR 73 | Temp 97.0°F | Ht 61.0 in | Wt 89.0 lb

## 2017-12-12 DIAGNOSIS — Z951 Presence of aortocoronary bypass graft: Secondary | ICD-10-CM | POA: Diagnosis not present

## 2017-12-12 DIAGNOSIS — I10 Essential (primary) hypertension: Secondary | ICD-10-CM

## 2017-12-12 NOTE — Progress Notes (Signed)
   HPI  Patient presents today here to follow-up for hypertension.  Patient explains that for several weeks, after the color of her metoprolol pill changed, she has had problems with metoprolol.  She arrives feeling weak and fatigued when she takes metoprolol plus lisinopril/HCTZ.  She states that she tolerated it well before the change.  She states that she was able to find a pharmacy with the old specific manufacturer of her metoprolol, she switched back and then felt worse.  Patient has not taken the medication today. She denies headache or chest pain.  She has history of CABG  PMH: Smoking status noted ROS: Per HPI  Objective: BP 121/62   Pulse 73   Temp (!) 97 F (36.1 C) (Oral)   Ht 5\' 1"  (1.549 m)   Wt 89 lb (40.4 kg)   BMI 16.82 kg/m  Gen: NAD, alert, cooperative with exam HEENT: NCAT CV: RRR, good S1/S2, no murmur Resp: CTABL, no wheezes, non-labored Ext: No edema, warm Neuro: Alert and oriented, No gross deficits  Assessment and plan:  #Hypertension, CAD status post CABG For now we will discontinue metoprolol, continue only lisinopril/HCTZ Follow-up 2 weeks for blood pressure check and to consider changing beta-blockers as she does not seem to be tolerating this when. Consider bisoprolol  Laroy Apple, MD [facility] 12/12/2017, 1:23 PM

## 2017-12-12 NOTE — Patient Instructions (Signed)
Great to see you!  Continue lisinopril/HCTZ only for  2 weeks then come back for a follow up for high blood pressure.

## 2017-12-26 ENCOUNTER — Ambulatory Visit (INDEPENDENT_AMBULATORY_CARE_PROVIDER_SITE_OTHER): Payer: Medicare Other | Admitting: Family Medicine

## 2017-12-26 ENCOUNTER — Encounter: Payer: Self-pay | Admitting: Family Medicine

## 2017-12-26 VITALS — BP 163/68 | HR 71 | Temp 97.6°F | Ht 61.0 in | Wt 90.2 lb

## 2017-12-26 DIAGNOSIS — Z951 Presence of aortocoronary bypass graft: Secondary | ICD-10-CM | POA: Diagnosis not present

## 2017-12-26 DIAGNOSIS — I10 Essential (primary) hypertension: Secondary | ICD-10-CM | POA: Diagnosis not present

## 2017-12-26 MED ORDER — CARVEDILOL 3.125 MG PO TABS: 3.1250 mg | ORAL_TABLET | Freq: Two times a day (BID) | ORAL | 3 refills | Status: DC

## 2017-12-26 NOTE — Progress Notes (Signed)
   HPI  Patient presents today for follow-up of her blood pressure.  Patient states that previously when she was taking metoprolol plus Prinzide she was feeling weak and tired.  Until today she has felt much better after stopping metoprolol.  She states that her blood pressure feels higher today with a mild headache.  She has history of CABG.  Patient states that she was informed that she had a credit card bill that was unexpected from 2 years ago that she had believed she paid off.  This is been very frustrating and stressful.  PMH: Smoking status noted ROS: Per HPI  Objective: BP (!) 163/68   Pulse 71   Temp 97.6 F (36.4 C) (Oral)   Ht 5\' 1"  (1.549 m)   Wt 90 lb 3.2 oz (40.9 kg)   BMI 17.04 kg/m  Gen: NAD, alert, cooperative with exam HEENT: NCAT CV: RRR, good S1/S2, no murmur Resp: CTABL, no wheezes, non-labored Ext: No edema, warm Neuro: Alert and oriented, No gross deficits  Assessment and plan:  #Hypertension, status post CABG Blood pressure not well optimized today, believe it is higher than she actually is based on her stress level today. Continue Prinzide at current dose, adding back beta-blocker with Coreg 3.125 twice daily. Refer to cardiology to establish care, her cardiologist is in [place] and is too far away for her.    Orders Placed This Encounter  Procedures  . Ambulatory referral to Cardiology    Referral Priority:   Routine    Referral Type:   Consultation    Referral Reason:   Specialty Services Required    Referred to Provider:   Satira Sark, MD    Requested Specialty:   Cardiology    Number of Visits Requested:   1    Meds ordered this encounter  Medications  . carvedilol (COREG) 3.125 MG tablet    Sig: Take 1 tablet (3.125 mg total) by mouth 2 (two) times daily with a meal.    Dispense:  60 tablet    Refill:  [place], MD [facility] 12/26/2017, 2:19 PM

## 2017-12-26 NOTE — Patient Instructions (Signed)
Great to see you!  Come back in 1 month for blood pressure check, we will work on a referral to Dr. Domenic Polite.

## 2017-12-30 ENCOUNTER — Telehealth: Payer: Self-pay | Admitting: Family Medicine

## 2017-12-30 ENCOUNTER — Other Ambulatory Visit: Payer: Self-pay | Admitting: Family Medicine

## 2017-12-30 NOTE — Telephone Encounter (Signed)
Pt states that she went and saw her cardiologist yesterday and he told her that he didn't want her taking the new bp medciation that [facility] sent in, wanted her to stay on there lisinopril, her BP yesterday afternoon was 119/62, cardiologist said if her BP stays like that for a month he was going to take her off the BP medications. She called the pharmacy and told them she would not be picking up the new BP medication

## 2017-12-30 NOTE — Telephone Encounter (Signed)
Sounds fine.  Defer to cardiology.   Laroy Apple, MD [facility] 12/30/2017, 2:51 PM

## 2018-01-01 ENCOUNTER — Telehealth: Payer: Self-pay | Admitting: *Deleted

## 2018-01-01 MED ORDER — ALBUTEROL SULFATE HFA 108 (90 BASE) MCG/ACT IN AERS: 2.0000 | INHALATION_SPRAY | RESPIRATORY_TRACT | 2 refills | Status: DC | PRN

## 2018-01-01 NOTE — Telephone Encounter (Signed)
Refill sent to pharmacy.   

## 2018-01-26 ENCOUNTER — Ambulatory Visit (INDEPENDENT_AMBULATORY_CARE_PROVIDER_SITE_OTHER): Payer: Medicare Other | Admitting: Family Medicine

## 2018-01-26 ENCOUNTER — Encounter: Payer: Self-pay | Admitting: Family Medicine

## 2018-01-26 VITALS — BP 158/69 | HR 66 | Temp 98.0°F | Ht 61.0 in | Wt 90.2 lb

## 2018-01-26 DIAGNOSIS — I10 Essential (primary) hypertension: Secondary | ICD-10-CM

## 2018-01-26 DIAGNOSIS — J439 Emphysema, unspecified: Secondary | ICD-10-CM

## 2018-01-26 MED ORDER — FLUTICASONE FUROATE-VILANTEROL 200-25 MCG/INH IN AEPB: 1.0000 | INHALATION_SPRAY | Freq: Every day | RESPIRATORY_TRACT | 11 refills | Status: DC

## 2018-01-26 NOTE — Patient Instructions (Signed)
Come back in 4 months to see Dr. Lajuana Ripple

## 2018-01-26 NOTE — Progress Notes (Signed)
   HPI  Patient presents today for follow-up hypertension and COPD.  Patient states that she did not take Coreg, she states she saw her cardiologist the day after we talked and that he stated her blood pressure was too low to start the medication. She states she still has intermittent dizziness.  Her blood pressure at home is been 130, 140, 177, and 155.  PMH: Smoking status noted ROS: Per HPI  Objective: BP (!) 158/69   Pulse 66   Temp 98 F (36.7 C) (Oral)   Ht 5\' 1"  (1.549 m)   Wt 90 lb 3.2 oz (40.9 kg)   BMI 17.04 kg/m  Gen: NAD, alert, cooperative with exam HEENT: NCAT CV: RRR, good S1/S2, no murmur Resp: CTABL, no wheezes, non-labored Ext: No edema, warm Neuro: Alert and oriented, No gross deficits  Assessment and plan:  # HTN Elevated today, pt has appt with cardiology in the morning and I will defer changes to him.  Continue Prinzide S/p CABG  # emphysema Reports that she is doing well on Breo, she states she was not taking Anoro Refill [facility]    Meds ordered this encounter  Medications  . fluticasone furoate-vilanterol (BREO ELLIPTA) 200-25 MCG/INH AEPB    Sig: Inhale 1 puff into the lungs daily.    Dispense:  1 each    Refill:  11    Please [place], MD [facility] 01/26/2018, 3:42 PM

## 2018-02-02 ENCOUNTER — Ambulatory Visit: Payer: Medicare Other | Admitting: Cardiology

## 2018-02-03 ENCOUNTER — Ambulatory Visit (INDEPENDENT_AMBULATORY_CARE_PROVIDER_SITE_OTHER): Payer: Medicare Other | Admitting: Family Medicine

## 2018-02-03 ENCOUNTER — Encounter: Payer: Self-pay | Admitting: Family Medicine

## 2018-02-03 VITALS — BP 139/74 | HR 82 | Temp 97.6°F | Ht 61.0 in | Wt 90.0 lb

## 2018-02-03 DIAGNOSIS — R159 Full incontinence of feces: Secondary | ICD-10-CM

## 2018-02-03 DIAGNOSIS — K529 Noninfective gastroenteritis and colitis, unspecified: Secondary | ICD-10-CM

## 2018-02-03 DIAGNOSIS — R634 Abnormal weight loss: Secondary | ICD-10-CM | POA: Diagnosis not present

## 2018-02-03 DIAGNOSIS — I10 Essential (primary) hypertension: Secondary | ICD-10-CM | POA: Diagnosis not present

## 2018-02-03 NOTE — Progress Notes (Signed)
Subjective: CC: HTN and "losing a lot of fluid" PCP: [name], DO O[id number] [name] is a 84 y.o. female presenting to clinic today for:  1. HTN Patient reports that her blood pressure medication was actually adjusted down to lisinopril 10 mg only from lisinopril/HCTZ 20/12.5 mg by her cardiologist in [place], Dr. Sharyon Cable.  She notes that she never followed up with a new cardiologist because she felt like she been with her current cardiologist in [place] for a long time and already invested a lot of money into him.  She notes that blood pressure this morning was in the 096G systolic and that she did not take her blood pressure medication until around lunchtime.  No chest pain, shortness of breath.  2.  Chronic diarrhea/anal leakage/unplanned weight loss Patient reports that she used to weigh about 120 pounds but has gone down to about 90 pounds unintentionally.  Denies any nausea, vomiting, abdominal pain.  She does report chronic diarrhea that seems to be helped by Imodium and diet modification.  She notes the medical history of cholecystectomy and she feels like most of her symptoms started around that time.  She describes leaking from her rectum that occurs throughout the day independent of bowel movements.  She describes this as " clear water".  No hematochezia or melena.  She is never been evaluated or screened for colon cancer.  She does have a history of hypothyroidism with last TSH within normal range in June 2018.   ROS: Per HPI  Allergies  Allergen Reactions  . Codeine     REACTION: rashes swelling  . Ciprofloxacin Rash   Past Medical History:  Diagnosis Date  . COPD, severe    Question history of asthma  . Coronary artery disease    NSTEMI/high grade left main disease  . Dyslipidemia   . Hypertension   . Hypothyroidism   . Peripheral vascular disease (HCC)    50% left RAS  . RBBB (right bundle branch block)   . Status post coronary artery bypass  grafting    status post cabbage 2008    Current Outpatient Medications:  .  albuterol (PROAIR HFA) 108 (90 Base) MCG/ACT inhaler, Inhale 2 puffs into the lungs every 4 (four) hours as needed for wheezing or shortness of breath., Disp: 36 g, Rfl: 2 .  Fluticasone Furoate (ARNUITY ELLIPTA) 200 MCG/ACT AEPB, Inhale 1 puff into the lungs daily., Disp: 30 each, Rfl: 11 .  fluticasone furoate-vilanterol (BREO ELLIPTA) 200-25 MCG/INH AEPB, Inhale 1 puff into the lungs daily., Disp: 1 each, Rfl: 11 .  levothyroxine (SYNTHROID, LEVOTHROID) 125 MCG tablet, Take 0.5 tablets (62.5 mcg total) by mouth daily., Disp: 45 tablet, Rfl: 0 .  lisinopril (PRINIVIL,ZESTRIL) 10 MG tablet, Take 10 mg by mouth daily., Disp: , Rfl:  Social History   Socioeconomic History  . Marital status: Divorced    Spouse name: Not on file  . Number of children: Not on file  . Years of education: Not on file  . Highest education level: Not on file  Occupational History  . Not on file  Social Needs  . Financial resource strain: Not on file  . Food insecurity:    Worry: Not on file    Inability: Not on file  . Transportation needs:    Medical: Not on file    Non-medical: Not on file  Tobacco Use  . Smoking status: Former Smoker    Packs/day: 0.80    Years: 55.00  Pack years: 44.00    Types: Cigarettes    Last attempt to quit: 07/08/2006    Years since quitting: 11.5  . Smokeless tobacco: Never Used  Substance and Sexual Activity  . Alcohol use: No  . Drug use: No  . Sexual activity: Not on file  Lifestyle  . Physical activity:    Days per week: Not on file    Minutes per session: Not on file  . Stress: Not on file  Relationships  . Social connections:    Talks on phone: Not on file    Gets together: Not on file    Attends religious service: Not on file    Active member of club or organization: Not on file    Attends meetings of clubs or organizations: Not on file    Relationship status: Not on file  .  Intimate partner violence:    Fear of current or ex partner: Not on file    Emotionally abused: Not on file    Physically abused: Not on file    Forced sexual activity: Not on file  Other Topics Concern  . Not on file  Social History Narrative  . Not on file   No family history on file.  Objective: Office vital signs reviewed. BP 139/74   Pulse 82   Temp 97.6 F (36.4 C) (Oral)   Ht 5\' 1"  (1.549 m)   Wt 90 lb (40.8 kg)   BMI 17.01 kg/m   Physical Examination:  General: Awake, alert, thin elderly female, No acute distress HEENT: Normal    Neck: No goiter    Eyes: no exophthalmos Cardio: regular rate and rhythm, S1S2 heard, no murmurs appreciated Pulm: clear to auscultation bilaterally, no wheezes, rhonchi or rales; normal work of breathing on room air GI: soft, non-tender, non-distended, bowel sounds present x4, no hepatomegaly, no splenomegaly, no masses  Assessment/ Plan: 84 y.o. female   1. Unexplained weight loss I reviewed patient's chart and she is lost about 11 pounds since November 2018.  I reviewed her BMP and CBC results which demonstrated mild elevation in her creatinine.  No evidence of anemia or significant inflammation on CBC.  I wonder if this is related to chronic diarrhea versus undiagnosed malignancy versus malabsorptive disorder.  No previous colonoscopy.  I have referred her to gastroenterology for further evaluation.  We discussed that she may need abdominal imaging.  Reasons for return and emergent evaluation discussed.  Patient to follow-up as needed - Ambulatory referral to Gastroenterology  2. Incontinence of feces, unspecified fecal incontinence type She describes what sounds like anal leakage.  We discussed the pelvic floor exercises.  Handout was provided.  Continue to avoid greasy foods given history of cholecystectomy. - Ambulatory referral to Gastroenterology  3. Hypertension, benign Blood pressure under good control with lisinopril 10 mg.  I  asked her to have Dr. Sharyon Cable send me records with each visit.  4. Chronic diarrhea Referral to gastroenterology. - Ambulatory referral to Gastroenterology   Orders Placed This Encounter  Procedures  . Ambulatory referral to Gastroenterology    Referral Priority:   Routine    Referral Type:   Consultation    Referral Reason:   Specialty Services Required    Number of Visits Requested:   C[place], Myrtle [phone]

## 2018-02-03 NOTE — Patient Instructions (Signed)
Have Dr Sharyon Cable send me notes with each visit.  Kegel Exercises Kegel exercises help strengthen the muscles that support the rectum, vagina, small intestine, bladder, and uterus. Doing Kegel exercises can help:  Improve bladder and bowel control.  Improve sexual response.  Reduce problems and discomfort during pregnancy.  Kegel exercises involve squeezing your pelvic floor muscles, which are the same muscles you squeeze when you try to stop the flow of urine. The exercises can be done while sitting, standing, or lying down, but it is best to vary your position. Phase 1 exercises 1. Squeeze your pelvic floor muscles tight. You should feel a tight lift in your rectal area. If you are a female, you should also feel a tightness in your vaginal area. Keep your stomach, buttocks, and legs relaxed. 2. Hold the muscles tight for up to 10 seconds. 3. Relax your muscles. Repeat this exercise 50 times a day or as many times as told by your health care provider. Continue to do this exercise for at least 4-6 weeks or for as long as told by your health care provider. This information is not intended to replace advice given to you by your health care provider. Make sure you discuss any questions you have with your health care provider. Document Released: 06/10/2012 Document Revised: 02/17/2016 Document Reviewed: 05/14/2015 Elsevier Interactive Patient Education  [employer].

## 2018-02-05 ENCOUNTER — Encounter: Payer: Self-pay | Admitting: Internal Medicine

## 2018-02-12 ENCOUNTER — Ambulatory Visit (INDEPENDENT_AMBULATORY_CARE_PROVIDER_SITE_OTHER): Payer: Medicare Other | Admitting: Family Medicine

## 2018-02-12 ENCOUNTER — Encounter: Payer: Self-pay | Admitting: Family Medicine

## 2018-02-12 VITALS — BP 170/73 | HR 65 | Temp 97.2°F | Ht 61.0 in | Wt 89.5 lb

## 2018-02-12 DIAGNOSIS — R399 Unspecified symptoms and signs involving the genitourinary system: Secondary | ICD-10-CM

## 2018-02-12 NOTE — Patient Instructions (Signed)
Your urine sample showed no signs of infection in the office today but we will send it to the lab for a culture and call you if it comes back positive.

## 2018-02-15 NOTE — Progress Notes (Signed)
Chief Complaint  Patient presents with  . Urinary Tract Infection    pt here today c/o urinary urgency     HPI  Patient presents today for urinary frequency for several days. Denies fever . No flank pain. No nausea, vomiting.   PMH: Smoking status noted ROS: Per HPI  Objective: BP (!) 170/73   Pulse 65   Temp (!) 97.2 F (36.2 C) (Oral)   Ht 5\' 1"  (1.549 m)   Wt 89 lb 8 oz (40.6 kg)   BMI 16.91 kg/m  Gen: NAD, alert, cooperative with exam HEENT: NCAT, EOMI, PERRL CV: RRR, good S1/S2, no murmur Resp: CTABL, no wheezes, non-labored Abd: SNTND, BS present, no guarding or organomegaly Ext: No edema, warm Neuro: Alert and oriented, No gross deficits  Assessment and plan:  1. UTI symptoms     No orders of the defined types were placed in this encounter.   Orders Placed This Encounter  Procedures  . Urine Culture  . Urinalysis    Follow up as needed.  Claretta Fraise, MD

## 2018-02-16 LAB — URINALYSIS
Bilirubin, UA: NEGATIVE
Glucose, UA: NEGATIVE
Ketones, UA: NEGATIVE
PROTEIN UA: NEGATIVE
RBC, UA: NEGATIVE
Specific Gravity, UA: 1.025 (ref 1.005–1.030)
Urobilinogen, Ur: 0.2 mg/dL (ref 0.2–1.0)
pH, UA: 5 (ref 5.0–7.5)

## 2018-03-18 ENCOUNTER — Encounter: Payer: Self-pay | Admitting: Cardiology

## 2018-03-18 NOTE — Progress Notes (Signed)
Cardiology Office Note  Date: 03/19/2018   ID: [name], DOB 1933/03/05, MRN [id number]  PCP: [name], DO  Consulting Cardiologist: Rozann Lesches, MD   Chief Complaint  Patient presents with  . Coronary Artery Disease    History of Present Illness: [name] is an 84 y.o. female referred for cardiology consultation by Dr. Wendi Snipes to reestablish cardiology follow-up.  I reviewed her records.  She has apparently been following with Dr. Sharyon Cable in [place], and previously saw Dr. Harl Bowie in our practice back in 2016.  She has history of CAD status post CABG in 2008 including LIMA to LAD, SVG to OM1, and SVG to RCA.  She lives in her own home, reports completion of ADLs, no exertional angina symptoms or nitroglycerin requirement.  She has stable NYHA class II dyspnea.  No palpitations or syncope.  I reviewed her medications, she has been on Norvasc for blood pressure control, previously on ACE inhibitor.  He has not been taking aspirin regularly, does not have a fresh bottle of nitroglycerin.  She reports intolerance to statins.  No recent lipid panel.  She does recall having a stress test done with Dr. Sharyon Cable last year, we are requesting the results.  I did see her echocardiogram report as detailed below, LVEF approximately 60% without regional wall motion of normalities.  I personally reviewed her ECG today which shows sinus rhythm with right bundle branch block.  Past Medical History:  Diagnosis Date  . COPD, severe    Question history of asthma  . Coronary artery disease    NSTEMI/high grade left main disease  . Essential hypertension   . Hypothyroidism   . Mixed hyperlipidemia   . Peripheral vascular disease (HCC)    50% left RAS  . RBBB (right bundle branch block)   . Status post coronary artery bypass grafting 2008    Past Surgical History:  Procedure Laterality Date  . CHOLECYSTECTOMY  2016  . CORONARY ARTERY BYPASS GRAFT  3/08   Three-vessel: LIMA-LAD; SCG-OM1; SVG-RCA; normal LV function  . EYE SURGERY      Current Outpatient Medications  Medication Sig Dispense Refill  . albuterol (PROAIR HFA) 108 (90 Base) MCG/ACT inhaler Inhale 2 puffs into the lungs every 4 (four) hours as needed for wheezing or shortness of breath. 36 g 2  . fluticasone furoate-vilanterol (BREO ELLIPTA) 200-25 MCG/INH AEPB Inhale 1 puff into the lungs daily. 1 each 11  . levothyroxine (SYNTHROID, LEVOTHROID) 125 MCG tablet Take 0.5 tablets (62.5 mcg total) by mouth daily. 45 tablet 0  . amLODipine (NORVASC) 5 MG tablet Take 1 tablet (5 mg total) by mouth every evening. 90 tablet 2  . aspirin EC 81 MG tablet Take 1 tablet (81 mg total) by mouth daily. 90 tablet 3  . nitroGLYCERIN (NITROSTAT) 0.4 MG SL tablet Place 1 tablet (0.4 mg total) under the tongue every 5 (five) minutes x 3 doses as needed for chest pain (for severe chest pain. If no relief after 3rd dose, proceed to the ED for an evaluation). 25 tablet 3   No current facility-administered medications for this visit.    Allergies:  Codeine; Prednisone; and Ciprofloxacin   Social History: The patient  reports that she quit smoking about 11 years ago. Her smoking use included cigarettes. She has a 44.00 pack-year smoking history. She has never used smokeless tobacco. She reports that she does not drink alcohol or use drugs.   Family History: The patient's family history  is not on file.   ROS:  Please see the history of present illness. Otherwise, complete review of systems is positive for vision problems with left eye, follows with ophthalmologist..  All other systems are reviewed and negative.   Physical Exam: VS:  BP (!) 152/78   Pulse 69   Ht 5\' 2"  (1.575 m)   Wt 90 lb 3.2 oz (40.9 kg)   SpO2 94%   BMI 16.50 kg/m , BMI Body mass index is 16.5 kg/m.  Wt Readings from Last 3 Encounters:  03/19/18 90 lb 3.2 oz (40.9 kg)  02/12/18 89 lb 8 oz (40.6 kg)  02/03/18 90 lb (40.8 kg)     General: Thin elderly woman, appears comfortable at rest. HEENT: Somewhat injected conjunctiva on the left and lids normal, oropharynx clear with moist mucosa. Neck: Supple, no elevated JVP or carotid bruits, no thyromegaly. Lungs: Clear to auscultation, nonlabored breathing at rest. Cardiac: Regular rate and rhythm, no S3, soft systolic murmur, no pericardial rub. Abdomen: Soft, nontender, bowel sounds present, no guarding or rebound. Extremities: No pitting edema, distal pulses 2+. Skin: Warm and dry. Musculoskeletal: Mild kyphosis. Neuropsychiatric: Alert and oriented x3, affect grossly appropriate.  ECG: I personally reviewed the tracing from 03/22/2017 which showed sinus tachycardia with right bundle branch block and PVC.  Recent Labwork: 03/24/2017: ALT 19; AST 30 11/06/2017: BUN 19; Creatinine, Ser 1.04; Hemoglobin 11.3; Platelets 320; Potassium 4.0; Sodium 144     Component Value Date/Time   CHOL 210 (H) 11/30/2015 1237   TRIG 76 11/30/2015 1237   HDL 67 11/30/2015 1237   CHOLHDL 3.1 11/30/2015 1237   CHOLHDL 3.3 09/06/2013 1007   VLDL 13 09/06/2013 1007   LDLCALC 128 (H) 11/30/2015 1237   LDLDIRECT 139 (H) 10/22/2016 1423    Other Studies Reviewed Today:  Echocardiogram 02/06/2017 (Dr. Sharyon Cable): Normal LV wall thickness with LVEF approximately 60%, normal left atrial chamber size, trivial mitral regurgitation, normal right ventricular contraction, mildly sclerotic aortic valve without stenosis, trivial tricuspid regurgitation, PASP estimated 31 mmHg, no pericardial effusion.  Assessment and Plan:  1.  Multivessel CAD status post CABG in 2008 including LIMA to LAD, SVG to OM1, and SVG to RCA.  She does not report any active angina at this time and states that she underwent a stress test with Dr. Sharyon Cable last year.  Plan is to request outside cardiac records.  I recommended that she start taking aspirin 81 mg daily, prescription also provided for as needed nitroglycerin.  We  will establish regular follow-up going forward.  2.  Mixed hyperlipidemia with reported statin intolerance.  Plan to obtain fasting lipid profile.  3.  Essential hypertension, increase Norvasc to 5 mg daily and continue to follow with PCP.  4.  Hypothyroidism on Synthroid followed by PCP.  Last TSH was 1.96.  Current medicines were reviewed with the patient today.   Orders Placed This Encounter  Procedures  . Lipid panel  . EKG 12-Lead    Disposition: Follow-up in 6 months.  Signed, Satira Sark, MD, [facility] 03/19/2018 1:49 PM    [facility] [place], [place],  [address] Phone: [phone]; Fax: [phone]

## 2018-03-19 ENCOUNTER — Ambulatory Visit (INDEPENDENT_AMBULATORY_CARE_PROVIDER_SITE_OTHER): Payer: Medicare Other | Admitting: Cardiology

## 2018-03-19 ENCOUNTER — Encounter: Payer: Self-pay | Admitting: Cardiology

## 2018-03-19 ENCOUNTER — Encounter: Payer: Self-pay | Admitting: *Deleted

## 2018-03-19 VITALS — BP 152/78 | HR 69 | Ht 62.0 in | Wt 90.2 lb

## 2018-03-19 DIAGNOSIS — E039 Hypothyroidism, unspecified: Secondary | ICD-10-CM

## 2018-03-19 DIAGNOSIS — I25119 Atherosclerotic heart disease of native coronary artery with unspecified angina pectoris: Secondary | ICD-10-CM | POA: Diagnosis not present

## 2018-03-19 DIAGNOSIS — E782 Mixed hyperlipidemia: Secondary | ICD-10-CM | POA: Diagnosis not present

## 2018-03-19 DIAGNOSIS — I10 Essential (primary) hypertension: Secondary | ICD-10-CM

## 2018-03-19 MED ORDER — ASPIRIN EC 81 MG PO TBEC: 81.0000 mg | DELAYED_RELEASE_TABLET | Freq: Every day | ORAL | 3 refills | Status: DC

## 2018-03-19 MED ORDER — NITROGLYCERIN 0.4 MG SL SUBL: 0.4000 mg | SUBLINGUAL_TABLET | SUBLINGUAL | 3 refills | Status: AC | PRN

## 2018-03-19 MED ORDER — AMLODIPINE BESYLATE 5 MG PO TABS: 5.0000 mg | ORAL_TABLET | Freq: Every evening | ORAL | 2 refills | Status: DC

## 2018-03-19 MED ORDER — NITROGLYCERIN 0.4 MG SL SUBL: 0.4000 mg | SUBLINGUAL_TABLET | SUBLINGUAL | 3 refills | Status: DC | PRN

## 2018-03-19 NOTE — Patient Instructions (Addendum)
Medication Instructions:   Your physician has recommended you make the following change in your medication:   Start aspirin 81 mg by mouth daily  Increase amlodipine to 5 mg by mouth daily in the evening. You may take (2) of your 2.5 mg tablets daily until they are finished.  Start Nitroglycerin 0.4 mg under your tongue for severe chest pain every 5 minutes up to 3 doses. If no relief after 3rd dose, go to the emergency room.  Continue all other medications the same.  Labwork:  Your physician recommends that you return for a FASTING lipid profile: as soon as possible. Lab order given today during visit.  Testing/Procedures:  NONE  Follow-Up:  Your physician recommends that you schedule a follow-up appointment in: 6 months. You will receive a reminder letter in the mail in about 4 months reminding you to call and schedule your appointment. If you don't receive this letter, please contact our office.  Any Other Special Instructions Will Be Listed Below (If Applicable).  If you need a refill on your cardiac medications before your next appointment, please call your pharmacy.

## 2018-03-25 ENCOUNTER — Telehealth: Payer: Self-pay | Admitting: *Deleted

## 2018-03-25 ENCOUNTER — Encounter: Payer: Self-pay | Admitting: *Deleted

## 2018-03-25 MED ORDER — EZETIMIBE 10 MG PO TABS: 10.0000 mg | ORAL_TABLET | Freq: Every day | ORAL | 2 refills | Status: DC

## 2018-03-25 NOTE — Telephone Encounter (Signed)
-----   Message from Satira Sark, MD sent at 03/25/2018  8:34 AM EDT ----- Results reviewed.  LDL 110.  She has a history of statin intolerance based on records and discussion.  Perhaps she would consider starting Zetia 10 mg daily. A copy of this test should be forwarded to [name], DO.

## 2018-03-25 NOTE — Telephone Encounter (Signed)
Patient informed and agrees to start zetia 10 mg. Rx sent and copy of lipid panel sent to PCP.

## 2018-03-30 ENCOUNTER — Ambulatory Visit (INDEPENDENT_AMBULATORY_CARE_PROVIDER_SITE_OTHER): Payer: Medicare Other | Admitting: *Deleted

## 2018-03-30 DIAGNOSIS — I10 Essential (primary) hypertension: Secondary | ICD-10-CM

## 2018-03-30 NOTE — Progress Notes (Signed)
Pt says since yesterday she has had a feeling of being "drunk" and blurry eyed and right sided headache and ankle swelling and tingling in her fingers - denies dizziness/SOB/chest pain - has only been taking 5 mg of amlodipine only for the last 2-3 days - BP today is 140/68 HR 79 - says she does see ophthalmology (care everywhere) - drove herself here  - will have someone come pick her up with her symptoms - will forward to Dr Domenic Polite

## 2018-03-30 NOTE — Progress Notes (Signed)
In case it has any relation to blood pressure, would reduce Norvasc back to 2.5 mg daily.

## 2018-03-31 ENCOUNTER — Telehealth: Payer: Self-pay | Admitting: Family Medicine

## 2018-03-31 NOTE — Telephone Encounter (Signed)
It appears that this was started by her Cardiologist.  I recommend stopping amlodipine and scheduling follow up with both cardiology and eye doctor.

## 2018-03-31 NOTE — Telephone Encounter (Signed)
Spoke with pt regarding vision changes since starting Amlodipine Pt states med is causing blurry vision Pt is taking amlodipine 2.5mg  Please advise

## 2018-03-31 NOTE — Progress Notes (Signed)
No answer no VM

## 2018-03-31 NOTE — Addendum Note (Signed)
Addended by: Julian Hy T on: 03/31/2018 12:55 PM   Modules accepted: Orders

## 2018-03-31 NOTE — Progress Notes (Signed)
Pt aware - says she has upcoming appt with her eye Dr to discuss the blurred vision - updated medication list

## 2018-04-02 NOTE — Telephone Encounter (Signed)
Pt called: She was started on by a cardio in [place] over 30 days ago at 2.5. She didn't like that Dr and switched over to Dr Domenic Polite with [facility] in [place], [place] - he increased it to 5 mg per day.  She also just seen a retina specialist on 9/19 and they are aware of these changes. She is wondering if you will take over the BP med and change this. Aware you are out of office until Friday and that we will let her know tomorrow.

## 2018-04-03 NOTE — Telephone Encounter (Signed)
Have her come in for an appointment and we will discuss medication.

## 2018-04-03 NOTE — Telephone Encounter (Signed)
Patient aware, appointment scheduled for 04/07/18 at 10:15 am

## 2018-04-06 ENCOUNTER — Telehealth: Payer: Self-pay | Admitting: Family Medicine

## 2018-04-06 NOTE — Telephone Encounter (Signed)
Second call °

## 2018-04-06 NOTE — Telephone Encounter (Signed)
Pt wanting to restart BP med she was taking prior to Amlodipine Pt has appt on Tuesday Will discuss with Dr Lajuana Ripple at appt

## 2018-04-07 ENCOUNTER — Ambulatory Visit (INDEPENDENT_AMBULATORY_CARE_PROVIDER_SITE_OTHER): Payer: Medicare Other | Admitting: Family Medicine

## 2018-04-07 ENCOUNTER — Encounter: Payer: Self-pay | Admitting: Family Medicine

## 2018-04-07 VITALS — BP 154/73 | HR 64 | Temp 96.9°F | Ht 62.0 in | Wt 86.0 lb

## 2018-04-07 DIAGNOSIS — I10 Essential (primary) hypertension: Secondary | ICD-10-CM

## 2018-04-07 DIAGNOSIS — Z2821 Immunization not carried out because of patient refusal: Secondary | ICD-10-CM

## 2018-04-07 MED ORDER — LISINOPRIL 10 MG PO TABS: 10.0000 mg | ORAL_TABLET | Freq: Every day | ORAL | 3 refills | Status: DC

## 2018-04-07 NOTE — Progress Notes (Signed)
Subjective: CC: HTN PCP: [name], DO GEX:[id number] [name] is a 84 y.o. female presenting to clinic today for:  1. HTN Patient notes that she was having lower extremity swelling and what she thought was visual disturbances related to the amlodipine.  She discontinued the medication on 04/02/2018 and states that the lower extremity swelling has improved but the vision has remained the same.  Of note, she is followed by ophthalmology for history of retinal detachment and other ocular needs.  She denies any chest pain.  Shortness of breath is stable.  She has known severe COPD.  She would like to go back on lisinopril, which she was previously prescribed her previous cardiologist.  ROS: Per HPI  Allergies  Allergen Reactions  . Codeine     REACTION: rashes swelling  . Prednisone   . Ciprofloxacin Rash   Past Medical History:  Diagnosis Date  . COPD, severe    Question history of asthma  . Coronary artery disease    NSTEMI/high grade left main disease  . Essential hypertension   . Hypothyroidism   . Mixed hyperlipidemia   . Peripheral vascular disease (HCC)    50% left RAS  . RBBB (right bundle branch block)   . Status post coronary artery bypass grafting 2008    Current Outpatient Medications:  .  albuterol (PROAIR HFA) 108 (90 Base) MCG/ACT inhaler, Inhale 2 puffs into the lungs every 4 (four) hours as needed for wheezing or shortness of breath., Disp: 36 g, Rfl: 2 .  amLODipine (NORVASC) 2.5 MG tablet, Take 2.5 mg by mouth daily., Disp: , Rfl:  .  aspirin EC 81 MG tablet, Take 1 tablet (81 mg total) by mouth daily., Disp: 90 tablet, Rfl: 3 .  fluticasone furoate-vilanterol (BREO ELLIPTA) 200-25 MCG/INH AEPB, Inhale 1 puff into the lungs daily., Disp: 1 each, Rfl: 11 .  levothyroxine (SYNTHROID, LEVOTHROID) 125 MCG tablet, Take 0.5 tablets (62.5 mcg total) by mouth daily., Disp: 45 tablet, Rfl: 0 .  nitroGLYCERIN (NITROSTAT) 0.4 MG SL tablet, Place 1 tablet (0.4 mg  total) under the tongue every 5 (five) minutes x 3 doses as needed for chest pain (for severe chest pain. If no relief after 3rd dose, proceed to the ED for an evaluation)., Disp: 25 tablet, Rfl: 3 Social History   Socioeconomic History  . Marital status: Divorced    Spouse name: Not on file  . Number of children: Not on file  . Years of education: Not on file  . Highest education level: Not on file  Occupational History  . Not on file  Social Needs  . Financial resource strain: Not on file  . Food insecurity:    Worry: Not on file    Inability: Not on file  . Transportation needs:    Medical: Not on file    Non-medical: Not on file  Tobacco Use  . Smoking status: Former Smoker    Packs/day: 0.80    Years: 55.00    Pack years: 44.00    Types: Cigarettes    Last attempt to quit: 07/08/2006    Years since quitting: 11.7  . Smokeless tobacco: Never Used  Substance and Sexual Activity  . Alcohol use: No  . Drug use: No  . Sexual activity: Not on file  Lifestyle  . Physical activity:    Days per week: Not on file    Minutes per session: Not on file  . Stress: Not on file  Relationships  .  Social connections:    Talks on phone: Not on file    Gets together: Not on file    Attends religious service: Not on file    Active member of club or organization: Not on file    Attends meetings of clubs or organizations: Not on file    Relationship status: Not on file  . Intimate partner violence:    Fear of current or ex partner: Not on file    Emotionally abused: Not on file    Physically abused: Not on file    Forced sexual activity: Not on file  Other Topics Concern  . Not on file  Social History Narrative  . Not on file   No family history on file.  Objective: Office vital signs reviewed. BP (!) 154/73   Pulse 64   Temp (!) 96.9 F (36.1 C) (Oral)   Ht 5\' 2"  (1.575 m)   Wt 86 lb (39 kg)   BMI 15.73 kg/m   Physical Examination:  General: Awake, alert, chronically  ill appearing female. No acute distress HEENT: MMM Cardio: regular rate and rhythm, S1S2 heard, no murmurs appreciated Pulm: Globally decreased breath sounds, no wheezes, rhonchi or rales; normal work of breathing on room air  Assessment/ Plan: 84 y.o. female   1. Hypertension, benign Not at goal but currently not on any antihypertensives.  I have added lisinopril 10 mg daily back to her regimen.  She actually brought in several bottles of various strengths of lisinopril.  I pulled out the 10 mg bottle and marked it so that she knew which one to take.  She will come in 5 to 7 days to have BMP checked.  She will come in the next 1 to 2 weeks to have blood pressure checked with the nurse and follow-up with me in 4 weeks. - Basic Metabolic Panel; Future  2. Influenza vaccine refused Counseled.  Meds ordered this encounter  Medications  . lisinopril (PRINIVIL,ZESTRIL) 10 MG tablet    Sig: Take 1 tablet (10 mg total) by mouth daily.    Dispense:  90 tablet    Refill:  [place], [place] [phone]

## 2018-04-07 NOTE — Patient Instructions (Signed)
   Take 1 tablet of Lisinopril 10mg  daily for your blood pressure.     Schedule a nurse visit for blood pressure check in 1-2 weeks.   Come in for your blood work in 5-7 days.   See me in 1 month for blood pressure follow up.

## 2018-04-13 ENCOUNTER — Telehealth: Payer: Self-pay | Admitting: *Deleted

## 2018-04-13 NOTE — Telephone Encounter (Signed)
Patient states that since starting the lisinopril she has been very weak and at times she feels like she is going to pass out. She took the lisinopril around 11am and before she took she felt fine and everyday after she takes it she feels bad. She does not have anyone to check it at this time.

## 2018-04-13 NOTE — Telephone Encounter (Signed)
If she is feeling bad from the medication, she should stop it.  We may need to cut her back to 1/2 tablet. I'd like her BP checked.  Can she come in for a RN visit?

## 2018-04-13 NOTE — Telephone Encounter (Signed)
Patient feels like it is from the medication. She can not come in before Wednesday and does have a nurse visit on Wednesday. Patient is not having any chest pain. Patient advised that is she starts experiencing any chest, sob, heart racing to be evaluated by the nearest ER. Patient will come in on Wed for BP check and if she can come before then she will call us.

## 2018-04-14 ENCOUNTER — Ambulatory Visit (INDEPENDENT_AMBULATORY_CARE_PROVIDER_SITE_OTHER): Payer: Medicare Other | Admitting: Family Medicine

## 2018-04-14 ENCOUNTER — Other Ambulatory Visit: Payer: Self-pay

## 2018-04-14 ENCOUNTER — Encounter (HOSPITAL_COMMUNITY): Payer: Self-pay | Admitting: Emergency Medicine

## 2018-04-14 ENCOUNTER — Emergency Department (HOSPITAL_COMMUNITY)
Admission: EM | Admit: 2018-04-14 | Discharge: 2018-04-14 | Disposition: A | Discharge status: Home / Self Care | Payer: Medicare Other | Attending: Emergency Medicine | Admitting: Emergency Medicine

## 2018-04-14 ENCOUNTER — Encounter: Payer: Self-pay | Admitting: *Deleted

## 2018-04-14 ENCOUNTER — Emergency Department (HOSPITAL_COMMUNITY): Payer: Medicare Other

## 2018-04-14 VITALS — BP 192/78 | HR 71 | Temp 96.8°F | Ht 62.0 in | Wt 91.4 lb

## 2018-04-14 DIAGNOSIS — E039 Hypothyroidism, unspecified: Secondary | ICD-10-CM | POA: Diagnosis not present

## 2018-04-14 DIAGNOSIS — I160 Hypertensive urgency: Secondary | ICD-10-CM | POA: Diagnosis not present

## 2018-04-14 DIAGNOSIS — Z87891 Personal history of nicotine dependence: Secondary | ICD-10-CM | POA: Insufficient documentation

## 2018-04-14 DIAGNOSIS — J449 Chronic obstructive pulmonary disease, unspecified: Secondary | ICD-10-CM | POA: Insufficient documentation

## 2018-04-14 DIAGNOSIS — E860 Dehydration: Secondary | ICD-10-CM | POA: Diagnosis not present

## 2018-04-14 DIAGNOSIS — R197 Diarrhea, unspecified: Secondary | ICD-10-CM

## 2018-04-14 DIAGNOSIS — I2510 Atherosclerotic heart disease of native coronary artery without angina pectoris: Secondary | ICD-10-CM | POA: Diagnosis not present

## 2018-04-14 DIAGNOSIS — R112 Nausea with vomiting, unspecified: Secondary | ICD-10-CM

## 2018-04-14 DIAGNOSIS — Z7982 Long term (current) use of aspirin: Secondary | ICD-10-CM | POA: Diagnosis not present

## 2018-04-14 DIAGNOSIS — I10 Essential (primary) hypertension: Secondary | ICD-10-CM

## 2018-04-14 DIAGNOSIS — R42 Dizziness and giddiness: Secondary | ICD-10-CM

## 2018-04-14 DIAGNOSIS — Z79899 Other long term (current) drug therapy: Secondary | ICD-10-CM | POA: Diagnosis not present

## 2018-04-14 DIAGNOSIS — H538 Other visual disturbances: Secondary | ICD-10-CM | POA: Diagnosis not present

## 2018-04-14 DIAGNOSIS — Z951 Presence of aortocoronary bypass graft: Secondary | ICD-10-CM | POA: Insufficient documentation

## 2018-04-14 LAB — HEPATIC FUNCTION PANEL
ALBUMIN: 3.5 g/dL (ref 3.5–5.0)
ALT: 17 U/L (ref 0–44)
AST: 21 U/L (ref 15–41)
Alkaline Phosphatase: 60 U/L (ref 38–126)
BILIRUBIN INDIRECT: 0.5 mg/dL (ref 0.3–0.9)
BILIRUBIN TOTAL: 0.7 mg/dL (ref 0.3–1.2)
Bilirubin, Direct: 0.2 mg/dL (ref 0.0–0.2)
Total Protein: 6.1 g/dL — ABNORMAL LOW (ref 6.5–8.1)

## 2018-04-14 LAB — BASIC METABOLIC PANEL
Anion gap: 10 (ref 5–15)
BUN / CREAT RATIO: 15 (ref 12–28)
BUN: 14 mg/dL (ref 8–27)
BUN: 13 mg/dL (ref 8–23)
CHLORIDE: 110 mmol/L (ref 98–111)
CO2: 20 mmol/L (ref 20–29)
CO2: 22 mmol/L (ref 22–32)
Calcium: 9.2 mg/dL (ref 8.7–10.3)
Calcium: 8.9 mg/dL (ref 8.9–10.3)
Chloride: 106 mmol/L (ref 96–106)
Creatinine, Ser: 0.92 mg/dL (ref 0.57–1.00)
Creatinine, Ser: 0.87 mg/dL (ref 0.44–1.00)
GFR, EST AFRICAN AMERICAN: 67 mL/min/{1.73_m2} (ref >59)
GFR, EST NON AFRICAN AMERICAN: 58 mL/min/{1.73_m2} — AB (ref >59)
GFR, EST NON AFRICAN AMERICAN: 60 mL/min — AB (ref >60)
Glucose, Bld: 91 mg/dL (ref 70–99)
Glucose: 90 mg/dL (ref 65–99)
POTASSIUM: 4 mmol/L (ref 3.5–5.2)
POTASSIUM: 3.5 mmol/L (ref 3.5–5.1)
SODIUM: 142 mmol/L (ref 135–145)
Sodium: 141 mmol/L (ref 134–144)

## 2018-04-14 LAB — URINALYSIS, ROUTINE W REFLEX MICROSCOPIC
BILIRUBIN URINE: NEGATIVE
Bacteria, UA: NONE SEEN
GLUCOSE, UA: NEGATIVE mg/dL
Ketones, ur: 5 mg/dL — AB
LEUKOCYTES UA: NEGATIVE
NITRITE: NEGATIVE
PROTEIN: NEGATIVE mg/dL
Specific Gravity, Urine: 1.01 (ref 1.005–1.030)
pH: 5 (ref 5.0–8.0)

## 2018-04-14 LAB — CBC
HEMATOCRIT: 36.5 % (ref 36.0–46.0)
HEMOGLOBIN: 10.6 g/dL — AB (ref 12.0–15.0)
MCH: 24.3 pg — ABNORMAL LOW (ref 26.0–34.0)
MCHC: 29 g/dL — AB (ref 30.0–36.0)
MCV: 83.5 fL (ref 80.0–100.0)
NRBC: 0 % (ref 0.0–0.2)
Platelets: 383 10*3/uL (ref 150–400)
RBC: 4.37 MIL/uL (ref 3.87–5.11)
RDW: 14.7 % (ref 11.5–15.5)
WBC: 8.5 10*3/uL (ref 4.0–10.5)

## 2018-04-14 LAB — I-STAT TROPONIN, ED: Troponin i, poc: 0.02 ng/mL (ref 0.00–0.08)

## 2018-04-14 LAB — POC OCCULT BLOOD, ED: Fecal Occult Bld: POSITIVE — AB

## 2018-04-14 MED ORDER — ALBUTEROL SULFATE HFA 108 (90 BASE) MCG/ACT IN AERS: 2.0000 | INHALATION_SPRAY | RESPIRATORY_TRACT | Status: DC | PRN

## 2018-04-14 MED ORDER — SODIUM CHLORIDE 0.9 % IV BOLUS
1000.0000 mL | Freq: Once | INTRAVENOUS | Status: AC
  Administered 2018-04-14: 1000 mL via INTRAVENOUS

## 2018-04-14 MED ORDER — ALBUTEROL SULFATE HFA 108 (90 BASE) MCG/ACT IN AERS: 2.0000 | INHALATION_SPRAY | RESPIRATORY_TRACT | 0 refills | Status: DC | PRN

## 2018-04-14 MED ORDER — LISINOPRIL 10 MG PO TABS: 10.0000 mg | ORAL_TABLET | Freq: Once | ORAL | Status: DC

## 2018-04-14 NOTE — ED Notes (Signed)
Got patient undress on the monitor patient is resting with family at bedside and call bell in reach 

## 2018-04-14 NOTE — Progress Notes (Signed)
Subjective: CC: Hypertension/ dizziness/ diarrhea PCP: Carla Norlander, DO WEX:HBZJIRC [name] is a 84 y.o. female presenting to clinic today for:  1. Hypertension Patient was seen for hypertension on 04/07/2018.  Her blood pressure at that time was running 154/73.  She was having difficulty with amlodipine and felt like it was causing her vision to blur.  For this reason, she is switched back onto her lisinopril 10 mg daily.  She took it for several days and felt like it was causing her to become dizzy and vision to become more blurred.  She notes that over the weekend, she developed diarrhea and vomiting that were nonbloody.  She has been trying to hydrate but feels tired and weak.  She drinks 2 cups of coffee today but no water.  She feels chilled and jittery.  No vomiting today.  Denies any chest pain but reports that she is had increasing shortness of breath today.  She reports decreased urine output since yesterday.  Last dose of lisinopril was yesterday at 11 AM.   ROS: Per HPI  Allergies  Allergen Reactions  . Codeine     REACTION: rashes swelling  . Prednisone   . Ciprofloxacin Rash   Past Medical History:  Diagnosis Date  . COPD, severe    Question history of asthma  . Coronary artery disease    NSTEMI/high grade left main disease  . Essential hypertension   . Hypothyroidism   . Mixed hyperlipidemia   . Peripheral vascular disease (HCC)    50% left RAS  . RBBB (right bundle branch block)   . Status post coronary artery bypass grafting 2008    Current Outpatient Medications:  .  albuterol (PROAIR HFA) 108 (90 Base) MCG/ACT inhaler, Inhale 2 puffs into the lungs every 4 (four) hours as needed for wheezing or shortness of breath., Disp: 36 g, Rfl: 2 .  aspirin EC 81 MG tablet, Take 1 tablet (81 mg total) by mouth daily., Disp: 90 tablet, Rfl: 3 .  fluticasone furoate-vilanterol (BREO ELLIPTA) 200-25 MCG/INH AEPB, Inhale 1 puff into the lungs daily., Disp: 1 each,  Rfl: 11 .  levothyroxine (SYNTHROID, LEVOTHROID) 125 MCG tablet, Take 0.5 tablets (62.5 mcg total) by mouth daily., Disp: 45 tablet, Rfl: 0 .  lisinopril (PRINIVIL,ZESTRIL) 10 MG tablet, Take 1 tablet (10 mg total) by mouth daily., Disp: 90 tablet, Rfl: 3 .  nitroGLYCERIN (NITROSTAT) 0.4 MG SL tablet, Place 1 tablet (0.4 mg total) under the tongue every 5 (five) minutes x 3 doses as needed for chest pain (for severe chest pain. If no relief after 3rd dose, proceed to the ED for an evaluation). (Patient not taking: Reported on 04/14/2018), Disp: 25 tablet, Rfl: 3 Social History   Socioeconomic History  . Marital status: Divorced    Spouse name: Not on file  . Number of children: Not on file  . Years of education: Not on file  . Highest education level: Not on file  Occupational History  . Not on file  Social Needs  . Financial resource strain: Not on file  . Food insecurity:    Worry: Not on file    Inability: Not on file  . Transportation needs:    Medical: Not on file    Non-medical: Not on file  Tobacco Use  . Smoking status: Former Smoker    Packs/day: 0.80    Years: 55.00    Pack years: 44.00    Types: Cigarettes    Last attempt to  quit: 07/08/2006    Years since quitting: 11.7  . Smokeless tobacco: Never Used  Substance and Sexual Activity  . Alcohol use: No  . Drug use: No  . Sexual activity: Not on file  Lifestyle  . Physical activity:    Days per week: Not on file    Minutes per session: Not on file  . Stress: Not on file  Relationships  . Social connections:    Talks on phone: Not on file    Gets together: Not on file    Attends religious service: Not on file    Active member of club or organization: Not on file    Attends meetings of clubs or organizations: Not on file    Relationship status: Not on file  . Intimate partner violence:    Fear of current or ex partner: Not on file    Emotionally abused: Not on file    Physically abused: Not on file    Forced  sexual activity: Not on file  Other Topics Concern  . Not on file  Social History Narrative  . Not on file   No family history on file.  Objective: Office vital signs reviewed. BP (!) 192/78   Pulse 71   Temp (!) 96.8 F (36 C) (Oral)   Ht 5\' 2"  (1.575 m)   Wt 91 lb 6.4 oz (41.5 kg)   SpO2 96%   BMI 16.72 kg/m   Physical Examination:  General: Awake, alert, ill appearing, shaky. No acute distress HEENT: Normal    Eyes: Right pupil reactive w/ extraocular membranes intact, sclera white    Throat: moist mucus membranes tacky Cardio: regular rate and rhythm, S1S2 heard, no murmurs appreciated Pulm: globally decreased breath sounds. no wheezes, rhonchi or rales; normal work of breathing on room air Extremities: warm, well perfused, No edema, cyanosis or clubbing; +2 pulses bilaterally Neuro: Alert and oriented.  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  Assessment/ Plan: 83 y.o. female   1. Hypertensive urgency Hypertensive urgency versus emergency given associated dizziness.  No focal neurologic deficits on exam.  She is legally blind in left eye.  We discussed that I want her seen in the emergency department for work-up, particularly given dizziness and shortness of breath. - Basic Metabolic Panel  2. Dehydration Physical exam is concerning for dehydration with tacky mucous membranes.  Her weight is stable/ increased since last visit.  I suspect she may have a viral GI illness and it is possible that dizziness may be a result of dehydration.  I have recommended that she seek immediate medical attention the emergency department.  I offered to have her transported by EMS but patient declined this because she would not be transported to [facility]. She wishes to have her family member bring her to [facility] instead of [facility].  She understands the risks of self transport and accepts these risks.  3. Nausea, vomiting and diarrhea ?GI  illness  4. Dizziness ?Dehdyration vs hypertensive emergency.   Carla Norlander, DO [place] [phone]

## 2018-04-14 NOTE — ED Notes (Signed)
Patient transported to X-ray 

## 2018-04-14 NOTE — Discharge Instructions (Addendum)
Take lisinopril and all medications as prescribed.  Take Imodium as directed for diarrhea.  Avoid milk or foods containing milk such as cheese or ice cream while having diarrhea.  You have trace amounts of blood in your bowel movements.  Lab work shows that you are mildly anemic however no blood transfusion required.  Keep your scheduled appoint with your gastroenterologist.  Call Dr.Gottschalk's office tomorrow to be reexamined in a week.

## 2018-04-14 NOTE — ED Triage Notes (Signed)
Seen Doctor today for blood pressure check due to changing medication recently. BP today at office 192/78. Sent to the ED for evaluation for hypertensive emergency and dehydration. Patient states at times feels like she is going to pass out. Alert answering and following commands appropriate.

## 2018-04-14 NOTE — ED Provider Notes (Signed)
Patient placed in Quick Look pathway, seen and evaluated   Chief Complaint: hypertension  HPI:   Recent BP medication changes by PCP, noted to be hypertensive in office and sent to ER for "hypertensive emergency" and "dehydration" per PCP documentation at bedside.   ROS: +light-headedness, intermittent blurred vision, chronic Sob secondary to COPD. Diarrhea for months.   Physical Exam:   Gen: No distress  Neuro: Awake and Alert  Skin: Warm    Focused Exam: RRR. CTAB. Trace pitting edema to tib/fib.    Initiation of care has begun. The patient has been counseled on the process, plan, and necessity for staying for the completion/evaluation, and the remainder of the medical screening examination    Arlean Hopping 04/14/18 1349    Jola Schmidt, MD 04/14/18 1627

## 2018-04-14 NOTE — ED Notes (Signed)
Pt requested to take home lisinopril medication at bedside.  Witnessed administration

## 2018-04-14 NOTE — Patient Instructions (Signed)
I am concerned about hypertensive emergency and dehydration.  You were offered transport to the hospital by ambulance but you declined this.  Please go directly to the emergency department.  [facility] ED triage nurse has been called.

## 2018-04-14 NOTE — ED Notes (Signed)
Patient verbalizes understanding of discharge instructions. Opportunity for questioning and answers were provided. Armband removed by staff, pt discharged from ED.  

## 2018-04-14 NOTE — ED Provider Notes (Addendum)
[facility] EMERGENCY DEPARTMENT Provider Note   CSN: [id number] Arrival date & time: 04/14/18  1228     History   Chief Complaint Chief Complaint  Patient presents with  . Hypertension    HPI [name] is a 84 y.o. female.  HPI complains of lightheadedness worse with standing for approximately the past 1 week" feeling "bleary eyed" she denies headache denies abdominal pain denies chest pain.  No other associated symptoms.  Seen by Dr.Gottchalk this morning in the office and sent here for further evaluation for possible hypertensive urgency, nausea vomiting and dehydration.  She admits to nausea and vomiting and diarrhea 1 week ago which has since resolved.  She did not take her blood pressure medicine this morning(lisinopril 10 mg) no other associated symptoms no treatment prior to coming here  Past Medical History:  Diagnosis Date  . COPD, severe    Question history of asthma  . Coronary artery disease    NSTEMI/high grade left main disease  . Essential hypertension   . Hypothyroidism   . Mixed hyperlipidemia   . Peripheral vascular disease (HCC)    50% left RAS  . RBBB (right bundle branch block)   . Status post coronary artery bypass grafting 2008    Patient Active Problem List   Diagnosis Date Noted  . Macular hole, left eye 07/12/2017  . Retinal detachment with multiple breaks, left eye 07/09/2017  . Status post aorto-coronary artery bypass graft 07/10/2012  . UNSPECIFIED HYPOTHYROIDISM 08/23/2008  . Hypertension, benign 08/23/2008  . CAD (coronary artery disease) of artery bypass graft 08/23/2008  . PVD 08/23/2008  . Chronic airway obstruction ([place]) 08/23/2008  . Hypothyroidism 08/23/2008  . Peripheral vascular disease ([place]) 08/23/2008  . Hyperlipidemia 08/23/2008    Past Surgical History:  Procedure Laterality Date  . CHOLECYSTECTOMY  2016  . CORONARY ARTERY BYPASS GRAFT  3/08   Three-vessel: LIMA-LAD; SCG-OM1; SVG-RCA; normal LV  function  . EYE SURGERY       OB History   None      Home Medications    Prior to Admission medications   Medication Sig Start Date End Date Taking? Authorizing Provider  albuterol (PROAIR HFA) 108 (90 Base) MCG/ACT inhaler Inhale 2 puffs into the lungs every 4 (four) hours as needed for wheezing or shortness of breath. 01/01/18  Yes Timmothy Euler, MD  aspirin EC 81 MG tablet Take 1 tablet (81 mg total) by mouth daily. 03/19/18  Yes Satira Sark, MD  Aspirin-Salicylamide-Caffeine [facility] HEADACHE POWDER PO) Take 1 packet by mouth as needed (headache).    Yes [provider]  fluticasone furoate-vilanterol (BREO ELLIPTA) 200-25 MCG/INH AEPB Inhale 1 puff into the lungs daily. 01/26/18  Yes Timmothy Euler, MD  levothyroxine (SYNTHROID, LEVOTHROID) 125 MCG tablet Take 0.5 tablets (62.5 mcg total) by mouth daily. 12/08/17  Yes Timmothy Euler, MD  lisinopril (PRINIVIL,ZESTRIL) 10 MG tablet Take 1 tablet (10 mg total) by mouth daily. 04/07/18  Yes Gottschalk, Leatrice Jewels M, DO  nitroGLYCERIN (NITROSTAT) 0.4 MG SL tablet Place 1 tablet (0.4 mg total) under the tongue every 5 (five) minutes x 3 doses as needed for chest pain (for severe chest pain. If no relief after 3rd dose, proceed to the ED for an evaluation). 03/19/18 06/17/18 Yes Satira Sark, MD    Family History No family history on file.  Social History Social History   Tobacco Use  . Smoking status: Former Smoker    Packs/day: 0.80  Years: 55.00    Pack years: 44.00    Types: Cigarettes    Last attempt to quit: 07/08/2006    Years since quitting: 11.7  . Smokeless tobacco: Never Used  Substance Use Topics  . Alcohol use: No  . Drug use: No     Allergies   Codeine; Prednisone; and Ciprofloxacin   Review of Systems Review of Systems  Eyes: Positive for visual disturbance.       Legally blind left eye  Gastrointestinal: Positive for diarrhea and vomiting.       Diarrhea and vomiting last week,  resolved.  Neurological: Positive for light-headedness.  All other systems reviewed and are negative.    Physical Exam Updated Vital Signs BP (!) 186/92   Pulse 80   Temp 97.8 F (36.6 C) (Oral)   Resp 20   Ht 5\' 2"  (1.575 m)   Wt 41 kg   SpO2 97%   BMI 16.53 kg/m   Physical Exam  Constitutional: She is oriented to person, place, and time. She appears well-developed and well-nourished.  HENT:  Head: Normocephalic and atraumatic.  Eyes: Conjunctivae and EOM are normal.  Left eye pupil dilated and nonreactive.  Right eye pupil reactive  Neck: Neck supple. No tracheal deviation present. No thyromegaly present.  Cardiovascular: Normal rate, regular rhythm, normal heart sounds and intact distal pulses.  No murmur heard. Pulmonary/Chest: Effort normal and breath sounds normal.  Abdominal: Soft. Bowel sounds are normal. She exhibits no distension. There is no tenderness.  Genitourinary:  Genitourinary Comments: Rectum normal tone brown stool Hemoccult positive  Musculoskeletal: Normal range of motion. She exhibits no edema or tenderness.  Neurological: She is alert and oriented to person, place, and time. Coordination normal.  Gait normal cranial nerves 3 through 12 grossly intact.  She is mildly lightheaded on standing.  Moves all extremities well  Skin: Skin is warm and dry. No rash noted.  Psychiatric: She has a normal mood and affect.  Nursing note and vitals reviewed.    ED Treatments / Results  Labs (all labs ordered are listed, but only abnormal results are displayed) Labs Reviewed  BASIC METABOLIC PANEL - Abnormal; Notable for the following components:      Result Value   GFR calc non Af Amer 60 (*)    All other components within normal limits  CBC - Abnormal; Notable for the following components:   Hemoglobin 10.6 (*)    MCH 24.3 (*)    MCHC 29.0 (*)    All other components within normal limits  URINALYSIS, ROUTINE W REFLEX MICROSCOPIC - Abnormal; Notable for  the following components:   Color, Urine STRAW (*)    Hgb urine dipstick SMALL (*)    Ketones, ur 5 (*)    All other components within normal limits  HEPATIC FUNCTION PANEL - Abnormal; Notable for the following components:   Total Protein 6.1 (*)    All other components within normal limits  I-STAT TROPONIN, ED  POC OCCULT BLOOD, ED    EKG EKG Interpretation  Date/Time:  Tuesday April 14 2018 13:26:29 EDT Ventricular Rate:  73 PR Interval:  134 QRS Duration: 132 QT Interval:  426 QTC Calculation: 469 R Axis:   79 Text Interpretation:  Sinus rhythm with occasional Premature ventricular complexes Right bundle branch block Abnormal ECG No significant change since last tracing Confirmed by Orlie Dakin [phone]) on 04/14/2018 3:22:02 PM   Radiology Dg Chest 2 View  Result Date: 04/14/2018 CLINICAL DATA:  Presyncope  today.  Chronic shortness of breath. EXAM: CHEST - 2 VIEW COMPARISON:  01/11/2017 and prior radiographs FINDINGS: Cardiomediastinal silhouette is unchanged.  CABG again noted. COPD changes are again identified. There is no evidence of focal airspace disease, pulmonary edema, suspicious pulmonary nodule/mass, pleural effusion, or pneumothorax. No acute bony abnormalities are identified. IMPRESSION: COPD without evidence of acute cardiopulmonary disease. Electronically Signed   By: Margarette Canada M.D.   On: 04/14/2018 14:39    Procedures Procedures (including critical care time)  Medications Ordered in ED Medications  sodium chloride 0.9 % bolus 1,000 mL (has no administration in time range)  lisinopril (PRINIVIL,ZESTRIL) tablet 10 mg (has no administration in time range)   Results for orders placed or performed during the hospital encounter of 28/41/32  Basic metabolic panel  Result Value Ref Range   Sodium 142 135 - 145 mmol/L   Potassium 3.5 3.5 - 5.1 mmol/L   Chloride 110 98 - 111 mmol/L   CO2 22 22 - 32 mmol/L   Glucose, Bld 91 70 - 99 mg/dL   BUN 13 8 - 23  mg/dL   Creatinine, Ser 0.87 0.44 - 1.00 mg/dL   Calcium 8.9 8.9 - 10.3 mg/dL   GFR calc non Af Amer 60 (L) >60 mL/min   GFR calc Af Amer >60 >60 mL/min   Anion gap 10 5 - 15  CBC  Result Value Ref Range   WBC 8.5 4.0 - 10.5 K/uL   RBC 4.37 3.87 - 5.11 MIL/uL   Hemoglobin 10.6 (L) 12.0 - 15.0 g/dL   HCT 36.5 36.0 - 46.0 %   MCV 83.5 80.0 - 100.0 fL   MCH 24.3 (L) 26.0 - 34.0 pg   MCHC 29.0 (L) 30.0 - 36.0 g/dL   RDW 14.7 11.5 - 15.5 %   Platelets 383 150 - 400 K/uL   nRBC 0.0 0.0 - 0.2 %  Urinalysis, Routine w reflex microscopic  Result Value Ref Range   Color, Urine STRAW (A) YELLOW   APPearance CLEAR CLEAR   Specific Gravity, Urine 1.010 1.005 - 1.030   pH 5.0 5.0 - 8.0   Glucose, UA NEGATIVE NEGATIVE mg/dL   Hgb urine dipstick SMALL (A) NEGATIVE   Bilirubin Urine NEGATIVE NEGATIVE   Ketones, ur 5 (A) NEGATIVE mg/dL   Protein, ur NEGATIVE NEGATIVE mg/dL   Nitrite NEGATIVE NEGATIVE   Leukocytes, UA NEGATIVE NEGATIVE   RBC / HPF 0-5 0 - 5 RBC/hpf   WBC, UA 0-5 0 - 5 WBC/hpf   Bacteria, UA NONE SEEN NONE SEEN   Mucus PRESENT    Hyaline Casts, UA PRESENT   Hepatic function panel  Result Value Ref Range   Total Protein 6.1 (L) 6.5 - 8.1 g/dL   Albumin 3.5 3.5 - 5.0 g/dL   AST 21 15 - 41 U/L   ALT 17 0 - 44 U/L   Alkaline Phosphatase 60 38 - 126 U/L   Total Bilirubin 0.7 0.3 - 1.2 mg/dL   Bilirubin, Direct 0.2 0.0 - 0.2 mg/dL   Indirect Bilirubin 0.5 0.3 - 0.9 mg/dL  I-Stat Troponin, ED (not at [facility])  Result Value Ref Range   Troponin i, poc 0.02 0.00 - 0.08 ng/mL   Comment 3          POC occult blood, ED Provider will collect  Result Value Ref Range   Fecal Occult Bld POSITIVE (A) NEGATIVE   Dg Chest 2 View  Result Date: 04/14/2018 CLINICAL DATA:  Presyncope today.  Chronic shortness of breath. EXAM: CHEST - 2 VIEW COMPARISON:  01/11/2017 and prior radiographs FINDINGS: Cardiomediastinal silhouette is unchanged.  CABG again noted. COPD changes are again identified.  There is no evidence of focal airspace disease, pulmonary edema, suspicious pulmonary nodule/mass, pleural effusion, or pneumothorax. No acute bony abnormalities are identified. IMPRESSION: COPD without evidence of acute cardiopulmonary disease. Electronically Signed   By: Margarette Canada M.D.   On: 04/14/2018 14:39    Initial Impression / Assessment and Plan / ED Course  I have reviewed the triage vital signs and the nursing notes.  Pertinent labs & imaging results that were available during my care of the patient were reviewed by me and considered in my medical decision making (see chart for details).     7:55 PM patient asymptomatic after treatment with normal saline intravenous bolus 1 L and lisinopril. Patient discussed with [place].  Plan take lisinopril as directed.  Encourage oral hydration avoid dairy.  She has follow-up appoint with gastroenterologist for diarrhea.  Advised patient and family as to anemia and Hemoccult-positive stools Lab work consistent with mild anemia.  Lightheadedness felt secondary to mild dehydration Final Clinical Impressions(s) / ED Diagnoses  Dx #1 dehydration #2 chronic diarrhea #3 Hemoccult-positive stools #4 elevated blood pressure  Final diagnoses:  None    ED Discharge Orders    None       Orlie Dakin, MD 04/14/18 2002 Patient requesting prescription for albuterol inhaler.  She has run out.  I have written for prescription for albuterol inhaler. Additional diagnosis: Medication refill   Orlie Dakin, MD 04/14/18 2037    Orlie Dakin, MD 04/14/18 2242

## 2018-04-16 ENCOUNTER — Telehealth: Payer: Self-pay | Admitting: Cardiology

## 2018-04-16 NOTE — Telephone Encounter (Signed)
Calling about being in hospital 10/8 after PCP Gottschalk changed her BP medication   She would like to talk about her medication with all her changes

## 2018-04-16 NOTE — Telephone Encounter (Signed)
Patient contacted office to let her provider know that she was taken off amlodipine and placed on lisinopril by the ED physician at [facility] after recent visit for dehydration.

## 2018-04-21 ENCOUNTER — Telehealth: Payer: Self-pay | Admitting: Cardiology

## 2018-04-21 NOTE — Telephone Encounter (Signed)
Patient's ankle swelling since her PCP changed her Blood pressure medication

## 2018-04-21 NOTE — Telephone Encounter (Signed)
For the time being she could cut the lisinopril to 5 mg daily.  Ideally however she should communicate her concerns to her PCP since they started the medication and have follow-up planned with her.

## 2018-04-21 NOTE — Telephone Encounter (Signed)
LM to return call.

## 2018-04-21 NOTE — Telephone Encounter (Signed)
Stated her pmd changed her to Lisinopril 10mg  - sees pmd this Monday.  Since starting on this pill - ankles have been swelling.  Does not go down at night either.  Does not feel like she has gained any weight.  No c/o chest pain, dizziness, or SOB.  Does c/o tiredness occasionally.

## 2018-04-21 NOTE — Telephone Encounter (Signed)
Patient notified and verbalized understanding. 

## 2018-04-22 ENCOUNTER — Other Ambulatory Visit: Payer: Self-pay | Admitting: Family Medicine

## 2018-04-22 ENCOUNTER — Telehealth: Payer: Self-pay | Admitting: Family Medicine

## 2018-04-22 ENCOUNTER — Other Ambulatory Visit: Payer: Self-pay | Admitting: *Deleted

## 2018-04-22 MED ORDER — ALBUTEROL SULFATE HFA 108 (90 BASE) MCG/ACT IN AERS: 2.0000 | INHALATION_SPRAY | RESPIRATORY_TRACT | 1 refills | Status: DC | PRN

## 2018-04-22 NOTE — Telephone Encounter (Signed)
Sure.  I am concerned if she is already out of the ventolin.  She just had this sent in 1 week ago.  She should not be out yet.

## 2018-04-22 NOTE — Telephone Encounter (Signed)
Can we re-send Ventolin to another pharmacy with a refill under your name?

## 2018-04-22 NOTE — Telephone Encounter (Signed)
Script is ready at the pharmacy in [place].

## 2018-04-22 NOTE — Telephone Encounter (Signed)
Aware.Script was sent to [facility].

## 2018-04-24 ENCOUNTER — Telehealth: Payer: Self-pay | Admitting: Cardiology

## 2018-04-24 NOTE — Telephone Encounter (Signed)
Has questions about her BP and why she is following up with her regular doctor and not Dr Domenic Polite for BP

## 2018-04-27 ENCOUNTER — Ambulatory Visit (INDEPENDENT_AMBULATORY_CARE_PROVIDER_SITE_OTHER): Payer: Medicare Other | Admitting: Family Medicine

## 2018-04-27 ENCOUNTER — Encounter: Payer: Self-pay | Admitting: Family Medicine

## 2018-04-27 VITALS — BP 169/70 | HR 79 | Temp 97.3°F | Ht 62.0 in | Wt 91.0 lb

## 2018-04-27 DIAGNOSIS — I10 Essential (primary) hypertension: Secondary | ICD-10-CM

## 2018-04-27 MED ORDER — LISINOPRIL-HYDROCHLOROTHIAZIDE 10-12.5 MG PO TABS: 1.0000 | ORAL_TABLET | Freq: Every day | ORAL | 3 refills | Status: DC

## 2018-04-27 NOTE — Progress Notes (Signed)
Subjective: CC: HTN PCP: [name], DO [name] is a 84 y.o. female presenting to clinic today for:  1. HTN Patient reports that she has been compliant with lisinopril 10 mg daily.  Denies any chest pain.  She has had some coughing but thinks that this is related to her underlying chronic bronchitis.  She has been using her albuterol inhaler and Brio inhalers for this.  She has not been checking her blood pressure at home because she does not have a cuff.  She reports lower extremity edema to her ankles.  She goes on to state that she does not salt her food but she does eat out frequently.   ROS: Per HPI  Allergies  Allergen Reactions  . Codeine     REACTION: rashes swelling  . Prednisone Other (See Comments)  . Ciprofloxacin Rash   Past Medical History:  Diagnosis Date  . COPD, severe    Question history of asthma  . Coronary artery disease    NSTEMI/high grade left main disease  . Essential hypertension   . Hypothyroidism   . Mixed hyperlipidemia   . Peripheral vascular disease (HCC)    50% left RAS  . RBBB (right bundle branch block)   . Status post coronary artery bypass grafting 2008    Current Outpatient Medications:  .  albuterol (PROVENTIL HFA;VENTOLIN HFA) 108 (90 Base) MCG/ACT inhaler, Inhale 2 puffs into the lungs every 4 (four) hours as needed for wheezing or shortness of breath., Disp: 1 Inhaler, Rfl: 1 .  aspirin EC 81 MG tablet, Take 1 tablet (81 mg total) by mouth daily., Disp: 90 tablet, Rfl: 3 .  Aspirin-Salicylamide-Caffeine (BC HEADACHE POWDER PO), Take 1 packet by mouth as needed (headache). , Disp: , Rfl:  .  fluticasone furoate-vilanterol (BREO ELLIPTA) 200-25 MCG/INH AEPB, Inhale 1 puff into the lungs daily., Disp: 1 each, Rfl: 11 .  levothyroxine (SYNTHROID, LEVOTHROID) 125 MCG tablet, Take 0.5 tablets (62.5 mcg total) by mouth daily., Disp: 45 tablet, Rfl: 0 .  lisinopril (PRINIVIL,ZESTRIL) 10 MG tablet, Take 1 tablet (10 mg  total) by mouth daily., Disp: 90 tablet, Rfl: 3 .  nitroGLYCERIN (NITROSTAT) 0.4 MG SL tablet, Place 1 tablet (0.4 mg total) under the tongue every 5 (five) minutes x 3 doses as needed for chest pain (for severe chest pain. If no relief after 3rd dose, proceed to the ED for an evaluation)., Disp: 25 tablet, Rfl: 3 .  VENTOLIN HFA 108 (90 Base) MCG/ACT inhaler, INHALE 2 PUFFS INTO THE LUNGS EVERY 4 (FOUR) HOURS AS NEEDED FOR WHEEZING OR SHORTNESS OF BREATH., Disp: 36 Inhaler, Rfl: 2 Social History   Socioeconomic History  . Marital status: Divorced    Spouse name: Not on file  . Number of children: Not on file  . Years of education: Not on file  . Highest education level: Not on file  Occupational History  . Not on file  Social Needs  . Financial resource strain: Not on file  . Food insecurity:    Worry: Not on file    Inability: Not on file  . Transportation needs:    Medical: Not on file    Non-medical: Not on file  Tobacco Use  . Smoking status: Former Smoker    Packs/day: 0.80    Years: 55.00    Pack years: 44.00    Types: Cigarettes    Last attempt to quit: 07/08/2006    Years since quitting: 11.8  . Smokeless tobacco:  Never Used  Substance and Sexual Activity  . Alcohol use: No  . Drug use: No  . Sexual activity: Not on file  Lifestyle  . Physical activity:    Days per week: Not on file    Minutes per session: Not on file  . Stress: Not on file  Relationships  . Social connections:    Talks on phone: Not on file    Gets together: Not on file    Attends religious service: Not on file    Active member of club or organization: Not on file    Attends meetings of clubs or organizations: Not on file    Relationship status: Not on file  . Intimate partner violence:    Fear of current or ex partner: Not on file    Emotionally abused: Not on file    Physically abused: Not on file    Forced sexual activity: Not on file  Other Topics Concern  . Not on file  Social  History Narrative  . Not on file   No family history on file.  Objective: Office vital signs reviewed. BP (!) 169/70   Pulse 79   Temp (!) 97.3 F (36.3 C) (Oral)   Ht 5\' 2"  (1.575 m)   Wt 91 lb (41.3 kg)   BMI 16.64 kg/m   Physical Examination:  General: Awake, alert, chronically ill appearing female, No acute distress HEENT: MMM Cardio: regular rate and rhythm, S1S2 heard, no murmurs appreciated Pulm: Global expiratory wheezes noted.  Normal work of breathing on room air. Extremities: warm, well perfused, trace ankle edema. No cyanosis or clubbing; +2 pulses bilaterally  Assessment/ Plan: 84 y.o. female   1. Accelerated hypertension Blood pressure continues to be elevated above goal.  I have added hydrochlorothiazide 12.5 mg to her regimen since she is having lower extremity edema.  We discussed avoidance of salty foods, including takeout foods to reduce blood pressure and the swelling.  I also encouraged her to use compression hose.  She will follow-up in 1 to 2 weeks for blood pressure recheck with the nurse.   No orders of the defined types were placed in this encounter.  Meds ordered this encounter  Medications  . lisinopril-hydrochlorothiazide (PRINZIDE,ZESTORETIC) 10-12.5 MG tablet    Sig: Take 1 tablet by mouth daily.    Dispense:  90 tablet    Refill:  3    To replace Lisinopril 10mg  tablets.     [name], DO [facility] [phone]

## 2018-04-27 NOTE — Patient Instructions (Signed)
I have prescribed you lisinopril plus hydrochlorothiazide 10/12.5mg .  Take this pill every morning.  It will cause increased urination and hopefully help with your ankle swelling.  I also want you to start wearing compression hose on your legs.  This will help with the swelling.  We discussed that eating out has a lot of salt and that is probably why your blood pressure and swelling is occurring.

## 2018-04-28 ENCOUNTER — Telehealth: Payer: Self-pay | Admitting: Family Medicine

## 2018-04-28 NOTE — Telephone Encounter (Signed)
Spoke with patient and advised her that her PCP would be able to manage her BP more closely since her f/u visits with Domenic Polite are not as close. Patient voiced that she was okay with this.

## 2018-05-04 ENCOUNTER — Ambulatory Visit: Payer: Medicare Other | Admitting: *Deleted

## 2018-05-04 ENCOUNTER — Other Ambulatory Visit: Payer: Self-pay

## 2018-05-04 ENCOUNTER — Emergency Department (HOSPITAL_COMMUNITY)
Admission: EM | Admit: 2018-05-04 | Discharge: 2018-05-04 | Disposition: A | Discharge status: Home / Self Care | Payer: Medicare Other | Attending: Emergency Medicine | Admitting: Emergency Medicine

## 2018-05-04 ENCOUNTER — Encounter (HOSPITAL_COMMUNITY): Payer: Self-pay

## 2018-05-04 VITALS — BP 198/77 | HR 67

## 2018-05-04 DIAGNOSIS — Z0130 Encounter for examination of blood pressure without abnormal findings: Secondary | ICD-10-CM

## 2018-05-04 DIAGNOSIS — E039 Hypothyroidism, unspecified: Secondary | ICD-10-CM | POA: Insufficient documentation

## 2018-05-04 DIAGNOSIS — I2510 Atherosclerotic heart disease of native coronary artery without angina pectoris: Secondary | ICD-10-CM | POA: Diagnosis not present

## 2018-05-04 DIAGNOSIS — J449 Chronic obstructive pulmonary disease, unspecified: Secondary | ICD-10-CM | POA: Diagnosis not present

## 2018-05-04 DIAGNOSIS — Z7982 Long term (current) use of aspirin: Secondary | ICD-10-CM | POA: Insufficient documentation

## 2018-05-04 DIAGNOSIS — Z951 Presence of aortocoronary bypass graft: Secondary | ICD-10-CM | POA: Insufficient documentation

## 2018-05-04 DIAGNOSIS — I10 Essential (primary) hypertension: Secondary | ICD-10-CM | POA: Insufficient documentation

## 2018-05-04 DIAGNOSIS — Z79899 Other long term (current) drug therapy: Secondary | ICD-10-CM | POA: Insufficient documentation

## 2018-05-04 DIAGNOSIS — Z87891 Personal history of nicotine dependence: Secondary | ICD-10-CM | POA: Insufficient documentation

## 2018-05-04 DIAGNOSIS — H538 Other visual disturbances: Secondary | ICD-10-CM | POA: Diagnosis present

## 2018-05-04 LAB — URINALYSIS, ROUTINE W REFLEX MICROSCOPIC
BILIRUBIN URINE: NEGATIVE
Glucose, UA: NEGATIVE mg/dL
KETONES UR: NEGATIVE mg/dL
NITRITE: NEGATIVE
PH: 7 (ref 5.0–8.0)
PROTEIN: NEGATIVE mg/dL
RBC / HPF: >50 RBC/hpf — ABNORMAL HIGH (ref 0–5)
Specific Gravity, Urine: 1.01 (ref 1.005–1.030)
WBC, UA: >50 WBC/hpf — ABNORMAL HIGH (ref 0–5)

## 2018-05-04 MED ORDER — CEPHALEXIN 500 MG PO CAPS
500.0000 mg | ORAL_CAPSULE | Freq: Once | ORAL | Status: AC
  Administered 2018-05-04: 500 mg via ORAL
  Filled 2018-05-04: qty 1

## 2018-05-04 MED ORDER — METOPROLOL TARTRATE 25 MG PO TABS: 12.5000 mg | ORAL_TABLET | Freq: Two times a day (BID) | ORAL | 0 refills | Status: DC

## 2018-05-04 MED ORDER — METOPROLOL TARTRATE 25 MG PO TABS
25.0000 mg | ORAL_TABLET | Freq: Once | ORAL | Status: AC
  Administered 2018-05-04: 25 mg via ORAL
  Filled 2018-05-04: qty 1

## 2018-05-04 MED ORDER — LISINOPRIL 10 MG PO TABS: 10.0000 mg | ORAL_TABLET | Freq: Once | ORAL | Status: DC

## 2018-05-04 MED ORDER — CEPHALEXIN 500 MG PO CAPS: 500.0000 mg | ORAL_CAPSULE | Freq: Two times a day (BID) | ORAL | 0 refills | Status: DC

## 2018-05-04 NOTE — ED Provider Notes (Signed)
[facility] EMERGENCY DEPARTMENT Provider Note   CSN: [id number] Arrival date & time: 05/04/18  1403     History   Chief Complaint Chief Complaint  Patient presents with  . Hypertension    HPI [name] is a 84 y.o. female.  Patient states her blood pressure is elevated..  She feels worsening blurred vision when her blood pressure becomes elevated.  Symptoms onset yesterday.  She went to her PMDs office earlier today.  Was told to go to the emergency department for further treatment.  She is had no recent changes in her medications.  She took her lisinopril 10 mill grams today as directed.  Denies any chest pain headache abdominal pain or shortness of breath or lightheadedness.  No other associated symptoms.  Treatment prior to coming here  HPI  Past Medical History:  Diagnosis Date  . COPD, severe    Question history of asthma  . Coronary artery disease    NSTEMI/high grade left main disease  . Essential hypertension   . Hypothyroidism   . Mixed hyperlipidemia   . Peripheral vascular disease (HCC)    50% left RAS  . RBBB (right bundle branch block)   . Status post coronary artery bypass grafting 2008    Patient Active Problem List   Diagnosis Date Noted  . Macular hole, left eye 07/12/2017  . Retinal detachment with multiple breaks, left eye 07/09/2017  . Status post aorto-coronary artery bypass graft 07/10/2012  . UNSPECIFIED HYPOTHYROIDISM 08/23/2008  . Hypertension, benign 08/23/2008  . CAD (coronary artery disease) of artery bypass graft 08/23/2008  . PVD 08/23/2008  . Chronic airway obstruction ([place]) 08/23/2008  . Hypothyroidism 08/23/2008  . Peripheral vascular disease ([place]) 08/23/2008  . Hyperlipidemia 08/23/2008    Past Surgical History:  Procedure Laterality Date  . CHOLECYSTECTOMY  2016  . CORONARY ARTERY BYPASS GRAFT  3/08   Three-vessel: LIMA-LAD; SCG-OM1; SVG-RCA; normal LV function  . EYE SURGERY       OB History   None      Home  Medications    Prior to Admission medications   Medication Sig Start Date End Date Taking? Authorizing Provider  aspirin EC 81 MG tablet Take 1 tablet (81 mg total) by mouth daily. 03/19/18   Satira Sark, MD  Aspirin-Salicylamide-Caffeine (BC HEADACHE POWDER PO) Take 1 packet by mouth as needed (headache).     [provider]  fluticasone furoate-vilanterol (BREO ELLIPTA) 200-25 MCG/INH AEPB Inhale 1 puff into the lungs daily. 01/26/18   Timmothy Euler, MD  levothyroxine (SYNTHROID, LEVOTHROID) 125 MCG tablet Take 0.5 tablets (62.5 mcg total) by mouth daily. 12/08/17   Timmothy Euler, MD  nitroGLYCERIN (NITROSTAT) 0.4 MG SL tablet Place 1 tablet (0.4 mg total) under the tongue every 5 (five) minutes x 3 doses as needed for chest pain (for severe chest pain. If no relief after 3rd dose, proceed to the ED for an evaluation). 03/19/18 06/17/18  Satira Sark, MD  VENTOLIN HFA 108 (90 Base) MCG/ACT inhaler INHALE 2 PUFFS INTO THE LUNGS EVERY 4 (FOUR) HOURS AS NEEDED FOR WHEEZING OR SHORTNESS OF BREATH. 04/22/18   Ronnie Doss M, DO  lisinopril-hydrochlorothiazide (PRINZIDE,ZESTORETIC) 10-12.5 MG tablet Take 1 tablet by mouth daily. 04/27/18 05/04/18  [name], DO    Family History No family history on file.  Social History Social History   Tobacco Use  . Smoking status: Former Smoker    Packs/day: 0.80    Years: 55.00  Pack years: 44.00    Types: Cigarettes    Last attempt to quit: 07/08/2006    Years since quitting: 11.8  . Smokeless tobacco: Never Used  Substance Use Topics  . Alcohol use: No  . Drug use: No     Allergies   Codeine; Prednisone; and Ciprofloxacin   Review of Systems Review of Systems  Constitutional: Negative.   HENT: Negative.   Eyes: Positive for visual disturbance.       Legally blind in left eye.  Worsening vision in right eye  Respiratory: Negative.   Cardiovascular: Negative.   Gastrointestinal: Negative.     Genitourinary: Positive for frequency.  Musculoskeletal: Negative.   Skin: Negative.   Neurological: Negative.   Psychiatric/Behavioral: Negative.   All other systems reviewed and are negative.    Physical Exam Updated Vital Signs BP (!) 189/81   Pulse 70   Temp 97.9 F (36.6 C) (Oral)   Resp 12   Ht 5' (1.524 m)   Wt 40.8 kg   SpO2 95%   BMI 17.58 kg/m   Physical Exam  Constitutional: She is oriented to person, place, and time. She appears well-developed and well-nourished.  HENT:  Head: Normocephalic and atraumatic.  Eyes: Conjunctivae are normal.  Fundi not well-visualized.  Left eye pupil irregular, nonreactive right eye equal 2 mm, reactive to light.  Visual acuity 20/50 right eye.  Unable to read eye chart with left eye.  Neck: Neck supple. No tracheal deviation present. No thyromegaly present.  No Bruit  Cardiovascular: Normal rate and regular rhythm.  No murmur heard. Pulmonary/Chest: Effort normal and breath sounds normal.  Abdominal: Soft. Bowel sounds are normal. She exhibits no distension. There is no tenderness.  Musculoskeletal: Normal range of motion. She exhibits no edema or tenderness.  Neurological: She is alert and oriented to person, place, and time. No cranial nerve deficit. Coordination normal.  Gait normal Romberg normal pronator drift normal finger-to-nose normal  Skin: Skin is warm and dry. No rash noted.  Psychiatric: She has a normal mood and affect.  Nursing note and vitals reviewed.    ED Treatments / Results  Labs (all labs ordered are listed, but only abnormal results are displayed) Labs Reviewed  URINALYSIS, ROUTINE W REFLEX MICROSCOPIC    EKG EKG Interpretation  Date/Time:  Monday May 04 2018 15:12:35 EDT Ventricular Rate:  72 PR Interval:  134 QRS Duration: 122 QT Interval:  436 QTC Calculation: 477 R Axis:   78 Text Interpretation:  Normal sinus rhythm Right bundle branch block Abnormal ECG No significant change  since last tracing Confirmed by Orlie Dakin [id number]) on 05/04/2018 6:36:23 PM   Radiology No results found.  Procedures Procedures (including critical care time)  Medications Ordered in ED Medications  metoprolol tartrate (LOPRESSOR) tablet 25 mg (has no administration in time range)    Results for orders placed or performed during the hospital encounter of 05/04/18  Urinalysis, Routine w reflex microscopic  Result Value Ref Range   Color, Urine YELLOW YELLOW   APPearance HAZY (A) CLEAR   Specific Gravity, Urine 1.010 1.005 - 1.030   pH 7.0 5.0 - 8.0   Glucose, UA NEGATIVE NEGATIVE mg/dL   Hgb urine dipstick MODERATE (A) NEGATIVE   Bilirubin Urine NEGATIVE NEGATIVE   Ketones, ur NEGATIVE NEGATIVE mg/dL   Protein, ur NEGATIVE NEGATIVE mg/dL   Nitrite NEGATIVE NEGATIVE   Leukocytes, UA LARGE (A) NEGATIVE   RBC / HPF >50 (H) 0 - 5 RBC/hpf   WBC,  UA >50 (H) 0 - 5 WBC/hpf   Bacteria, UA RARE (A) NONE SEEN   WBC Clumps PRESENT    Mucus PRESENT    Dg Chest 2 View  Result Date: 04/14/2018 CLINICAL DATA:  Presyncope today.  Chronic shortness of breath. EXAM: CHEST - 2 VIEW COMPARISON:  01/11/2017 and prior radiographs FINDINGS: Cardiomediastinal silhouette is unchanged.  CABG again noted. COPD changes are again identified. There is no evidence of focal airspace disease, pulmonary edema, suspicious pulmonary nodule/mass, pleural effusion, or pneumothorax. No acute bony abnormalities are identified. IMPRESSION: COPD without evidence of acute cardiopulmonary disease. Electronically Signed   By: Margarette Canada M.D.   On: 04/14/2018 14:39   Initial Impression / Assessment and Plan / ED Course  I have reviewed the triage vital signs and the nursing notes.  Pertinent labs & imaging results that were available during my care of the patient were reviewed by me and considered in my medical decision making (see chart for details).     8:20 PM she feels her vision is improving after treatment  with metoprolol.  Blood pressure has come down.  I strongly doubt endorgan damage.  Lab work consistent with urinary tract infection Scription Keflex, metoprolol.  Blood pressure recheck 1 week.  She is requesting new primary care physician referred [facility] Final Clinical Impressions(s) / ED Diagnoses  Diagnoses #1 hypertension Final diagnoses:  None  #2 urinary tract infection  ED Discharge Orders    None       Orlie Dakin, MD 05/04/18 2025

## 2018-05-04 NOTE — ED Notes (Signed)
Denies chest pain c/o feeling "fuzzy".

## 2018-05-04 NOTE — ED Triage Notes (Signed)
Pt sent to ED today from her doctor's office for high BP. BP today at the office was elevated and pt states she has been changing her medication recently. Pt states she is back on her Lisinopril 10 mg now and started it back about 1 week ago. Pt denies dizziness, or headache. Pt states she has had some blurry vision since this am.

## 2018-05-04 NOTE — Progress Notes (Signed)
Pt here for BP check Elevated BP Per Particia Nearing increase Lisinopril/HCTZ to 20mg /12.5mg  daily Pt declined increase due to med making her feel bad Glenard Haring suggested adding Amlodipine 10mg  Pt declined recommendation stating she is unable to take Amlodipine Pt then stated she was having worsening dizziness with vision changes Per Particia Nearing, pt needs to be evaluated in the ED Pt will go to [facility] ED with family member for evaluation

## 2018-05-04 NOTE — Discharge Instructions (Addendum)
Call the [facility] or your primary care physician to arrange to be seen within a week to get your blood pressure rechecked.  Start taking the medication prescribed tomorrow morning.  Return if concern for any reason

## 2018-05-07 ENCOUNTER — Telehealth: Payer: Self-pay | Admitting: Family Medicine

## 2018-05-07 NOTE — Telephone Encounter (Signed)
Blood pressure was 121/49 this morning on pharmacy blood pressure machine.  Denies feeling faint, tired, or dizzy.  States she felt "a little blurry eyed".  At ER on 05/04/18 she was prescribed metoprolol 12.5 mg twice daily in addition to her lisinopril/HCTZ which has helped her blood pressure come down significantly.  She is wondering if she should adjust the metoprolol?  Scheduled her for follow up with Dr. Lajuana Ripple on 05/12/18.

## 2018-05-08 LAB — URINE CULTURE: Special Requests: NORMAL

## 2018-05-08 NOTE — Telephone Encounter (Signed)
Please contact the patient : although the bottom number (49) was low, the top number was fine. Without significant symptoms, I would no make changes in the metoprolol this time. Follow up recommended

## 2018-05-08 NOTE — Telephone Encounter (Signed)
Pt notified of recommendation Verbalizes understanding 

## 2018-05-09 ENCOUNTER — Telehealth: Payer: Self-pay

## 2018-05-09 NOTE — Telephone Encounter (Signed)
Post ED Visit - Positive Culture Follow-up  Culture report reviewed by antimicrobial stewardship pharmacist:  []  Elenor Quinones, Pharm.D. []  Heide Guile, Pharm.D., BCPS AQ-ID []  Parks Neptune, Pharm.D., BCPS []  Alycia Rossetti, Pharm.D., BCPS []  Fords, Pharm.D., BCPS, AAHIVP []  Legrand Como, Pharm.D., BCPS, AAHIVP []  Salome Arnt, PharmD, BCPS []  Johnnette Gourd, PharmD, BCPS []  Hughes Better, PharmD, BCPS [x]  Leeroy Cha, PharmD  Positive urine culture Treated with Cephalexin, organism sensitive to the same and no further patient follow-up is required at this time.  Genia Del 05/09/2018, 10:41 AM

## 2018-05-11 ENCOUNTER — Ambulatory Visit: Payer: Medicare Other | Admitting: Family Medicine

## 2018-05-12 ENCOUNTER — Ambulatory Visit: Payer: Self-pay | Admitting: Family Medicine

## 2018-05-15 ENCOUNTER — Encounter: Payer: Self-pay | Admitting: Gastroenterology

## 2018-05-15 ENCOUNTER — Encounter: Payer: Self-pay | Admitting: *Deleted

## 2018-05-15 ENCOUNTER — Ambulatory Visit (INDEPENDENT_AMBULATORY_CARE_PROVIDER_SITE_OTHER): Payer: Medicare Other | Admitting: Gastroenterology

## 2018-05-15 ENCOUNTER — Encounter

## 2018-05-15 VITALS — BP 100/58 | HR 58 | Temp 97.1°F | Ht 62.0 in | Wt 89.6 lb

## 2018-05-15 DIAGNOSIS — R634 Abnormal weight loss: Secondary | ICD-10-CM

## 2018-05-15 DIAGNOSIS — I25119 Atherosclerotic heart disease of native coronary artery with unspecified angina pectoris: Secondary | ICD-10-CM

## 2018-05-15 DIAGNOSIS — K529 Noninfective gastroenteritis and colitis, unspecified: Secondary | ICD-10-CM | POA: Diagnosis not present

## 2018-05-15 DIAGNOSIS — R195 Other fecal abnormalities: Secondary | ICD-10-CM | POA: Insufficient documentation

## 2018-05-15 DIAGNOSIS — R159 Full incontinence of feces: Secondary | ICD-10-CM | POA: Insufficient documentation

## 2018-05-15 NOTE — Assessment & Plan Note (Signed)
Very pleasant 84 year old female presenting for further evaluation of unintentional weight loss, chronic diarrhea associated with incontinence, Hemoccult positive stool.  She believes all of her symptoms started after her cholecystectomy several years back.  She reports adequate oral intake.  She believes her weight loss is related to significant diarrhea.  Imodium in the past constipated her.  Her biggest concern is of daily significant fecal incontinence.  Would be concerned about underlying malignancy, mesenteric ischemia, microscopic colitis, bile acid diarrhea.  Doubt we are dealing with IBD or celiac disease.  We have offered her a colonoscopy to help sort out her symptoms to evaluate heme positive/rectal bleeding.  Plan for assistance from anesthesiology given her COPD.   I have discussed the risks, alternatives, benefits with regards to but not limited to the risk of reaction to medication, bleeding, infection, perforation and the patient is agreeable to proceed. Written consent to be obtained.  She has eye surgery scheduled for next month in request postponing colonoscopy till January.  She also has had issues with hypertensive urgency requiring change in her medications.  Today she has low normal blood pressure.  She sees her cardiologist Monday.  Interim we will update labs checking thyroid levels as she reports her Synthroid has been adjusted in the past, screen for celiac disease, update CBC.

## 2018-05-15 NOTE — Progress Notes (Signed)
Primary Care Physician:  [name], DO  Primary Gastroenterologist:  Garfield Cornea, MD   Chief Complaint  Patient presents with  . Diarrhea  . Weight Loss    weighed 120s before gb removed few yrs ago    HPI:  [name] is a 84 y.o. female here at the request of Dr. Lajuana Ripple for further evaluation of unintentional weight loss, chronic diarrhea.  She also had a heme positive stool during ED visit last month.  At that time hemoglobin 10.6 but hematocrit normal at 36.5, MCV 83.5.  Patient associates her weight loss and diarrhea to time of her cholecystectomy few years ago.  She cannot recall the exact date.  She states initially she was given a powder to try to help with the diarrhea, sounds like Questran.  At first did help and lost its efficacy.  Every time she eats she has postprandial.  She complains of fecal incontinence.  She has to wear protective undergarments at all times, sometimes her clothing still gets soiled.  Stool is anywhere from liquid to mushy.  She denies any nocturnal episodes.  However when she first wakes up she has to run to the bathroom and has a "gush of stool".  She believes she has a hemorrhoid.  Some bleeding at times.  Started on Metamucil, continues to have incontinence but stool is more firm.  Right now she is on antibiotic for UTI and the stools are loose again.  In the past she is also tried Imodium, she is developed significant constipation with this.  Denies any abdominal pain.  Her appetite is good.  She eats well.  Complains of significant weight loss since her gallbladder surgery.  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  Documented weights in epic September 2017 111 pounds April 2018  106 pounds November 2018 101 pounds March 2019  93 pounds June 2019  89 pounds Today   89 pounds  No prior colonoscopy.  She quit smoking 10 years ago when she had coronary artery bypass graft.  She has COPD, does  not use any inhalers.  Does not like to lay flat.  She continues to consume BCs regularly.  Used to take significant amounts.  Now she licks her finger and what ever aspirin powder sticks to it she takes that daily.     Current Outpatient Medications  Medication Sig Dispense Refill  . aspirin EC 81 MG tablet Take 1 tablet (81 mg total) by mouth daily. 90 tablet 3  . Aspirin-Salicylamide-Caffeine (BC HEADACHE POWDER PO) Take 1 packet by mouth as needed (headache).     . cephALEXin (KEFLEX) 500 MG capsule Take 1 capsule (500 mg total) by mouth 2 (two) times daily. 14 capsule 0  . levothyroxine (SYNTHROID, LEVOTHROID) 125 MCG tablet Take 0.5 tablets (62.5 mcg total) by mouth daily. 45 tablet 0  . metoprolol tartrate (LOPRESSOR) 25 MG tablet Take 0.5 tablets (12.5 mg total) by mouth 2 (two) times daily. 15 tablet 0  . nitroGLYCERIN (NITROSTAT) 0.4 MG SL tablet Place 1 tablet (0.4 mg total) under the tongue every 5 (five) minutes x 3 doses as needed for chest pain (for severe chest pain. If no relief after 3rd dose, proceed to the ED for an evaluation). 25 tablet 3  . VENTOLIN HFA 108 (90 Base) MCG/ACT inhaler INHALE 2 PUFFS INTO THE LUNGS EVERY 4 (FOUR) HOURS AS NEEDED FOR WHEEZING OR SHORTNESS OF BREATH. 36 Inhaler 2  No current facility-administered medications for this visit.     Allergies as of 05/15/2018 - Review Complete 05/15/2018  Allergen Reaction Noted  . Codeine  08/23/2008  . Prednisone Other (See Comments) 07/09/2017  . Ciprofloxacin Rash 11/02/2015    Past Medical History:  Diagnosis Date  . COPD, severe    Question history of asthma  . Coronary artery disease    NSTEMI/high grade left main disease  . Essential hypertension   . Hypothyroidism   . Mixed hyperlipidemia   . Peripheral vascular disease (HCC)    50% left RAS  . RBBB (right bundle branch block)   . Status post coronary artery bypass grafting 2008    Past Surgical History:  Procedure Laterality Date  .  CHOLECYSTECTOMY  2016  . CORONARY ARTERY BYPASS GRAFT  3/08   Three-vessel: LIMA-LAD; SCG-OM1; SVG-RCA; normal LV function  . EYE SURGERY      Family History  Problem Relation Age of Onset  . Colon cancer Neg Hx     Social History   Socioeconomic History  . Marital status: Divorced    Spouse name: Not on file  . Number of children: Not on file  . Years of education: Not on file  . Highest education level: Not on file  Occupational History  . Not on file  Social Needs  . Financial resource strain: Not on file  . Food insecurity:    Worry: Not on file    Inability: Not on file  . Transportation needs:    Medical: Not on file    Non-medical: Not on file  Tobacco Use  . Smoking status: Former Smoker    Packs/day: 0.80    Years: 55.00    Pack years: 44.00    Types: Cigarettes    Last attempt to quit: 07/08/2006    Years since quitting: 11.8  . Smokeless tobacco: Never Used  Substance and Sexual Activity  . Alcohol use: No  . Drug use: No  . Sexual activity: Not on file  Lifestyle  . Physical activity:    Days per week: Not on file    Minutes per session: Not on file  . Stress: Not on file  Relationships  . Social connections:    Talks on phone: Not on file    Gets together: Not on file    Attends religious service: Not on file    Active member of club or organization: Not on file    Attends meetings of clubs or organizations: Not on file    Relationship status: Not on file  . Intimate partner violence:    Fear of current or ex partner: Not on file    Emotionally abused: Not on file    Physically abused: Not on file    Forced sexual activity: Not on file  Other Topics Concern  . Not on file  Social History Narrative  . Not on file      ROS:  General: Negative for anorexia, fever, chills, fatigue, weakness. See hpi Eyes: Negative for vision changes.  ENT: Negative for hoarseness, difficulty swallowing , nasal congestion. CV: Negative for chest pain,  angina, palpitations, positive dyspnea on exertion, peripheral edema.  Respiratory: Negative for dyspnea at rest, positive dyspnea on exertion, cough, sputum, wheezing.  GI: See history of present illness. GU:  Negative for dysuria, hematuria, urinary incontinence, urinary frequency, nocturnal urination.  MS: Negative for joint pain, low back pain.  Derm: Negative for rash or itching.  Neuro: Negative for  weakness, abnormal sensation, seizure, frequent headaches, memory loss, confusion.  Some dizziness which she relates to her blood pressure issues. Psych: Negative for anxiety, depression, suicidal ideation, hallucinations.  Endo: See HPI Heme: Negative for bruising or bleeding. Allergy: Negative for rash or hives.    Physical Examination:  BP (!) 91/43   Pulse (!) 58   Temp (!) 97.1 F (36.2 C) (Oral)   Ht 5\' 2"  (1.575 m)   Wt 89 lb 9.6 oz (40.6 kg)   BMI 16.39 kg/m    Nursing staff rechecked blood pressure child-size cuff, reported blood pressure 100/60   General: Thin elderly female, no acute distress Head: Normocephalic, atraumatic.   Eyes: Conjunctiva pink, no icterus. Mouth: Oropharyngeal mucosa moist and pink , no lesions erythema or exudate. Neck: Supple without thyromegaly, masses, or lymphadenopathy.  Lungs: Clear to auscultation bilaterally.  Heart: Regular rate and rhythm, no murmurs rubs or gallops.  Abdomen: Bowel sounds are normal, nontender, nondistended, no hepatosplenomegaly or masses, no abdominal bruits or    hernia , no rebound or guarding.   Rectal: Not performed, deferred to time of colonoscopy Extremities: No lower extremity edema. No clubbing or deformities.  Neuro: Alert and oriented x 4 , grossly normal neurologically.  Skin: Warm and dry, no rash or jaundice.   Psych: Alert and cooperative, normal mood and affect.  Labs: Lab Results  Component Value Date   CREATININE 0.87 04/14/2018   BUN 13 04/14/2018   NA 142 04/14/2018   K 3.5 04/14/2018    CL 110 04/14/2018   CO2 22 04/14/2018   Lab Results  Component Value Date   ALT 17 04/14/2018   AST 21 04/14/2018   ALKPHOS 60 04/14/2018   BILITOT 0.7 04/14/2018   Lab Results  Component Value Date   WBC 8.5 04/14/2018   HGB 10.6 (L) 04/14/2018   HCT 36.5 04/14/2018   MCV 83.5 04/14/2018   PLT 383 04/14/2018     Imaging Studies: No results found.

## 2018-05-15 NOTE — Patient Instructions (Addendum)
Please have labs done to evaluate your thyroid level and diarrhea. I checked your recent labs from PCP and this has not been done.   Colonoscopy as scheduled. See separate instructions.   Please discuss your low blood pressure with your PCP to make appropriate adjustments in your medication.

## 2018-05-18 ENCOUNTER — Telehealth: Payer: Self-pay | Admitting: Internal Medicine

## 2018-05-18 ENCOUNTER — Telehealth: Payer: Self-pay | Admitting: *Deleted

## 2018-05-18 ENCOUNTER — Other Ambulatory Visit: Payer: Self-pay | Admitting: *Deleted

## 2018-05-18 DIAGNOSIS — R634 Abnormal weight loss: Secondary | ICD-10-CM

## 2018-05-18 DIAGNOSIS — R195 Other fecal abnormalities: Secondary | ICD-10-CM

## 2018-05-18 DIAGNOSIS — K529 Noninfective gastroenteritis and colitis, unspecified: Secondary | ICD-10-CM

## 2018-05-18 MED ORDER — NA SULFATE-K SULFATE-MG SULF 17.5-3.13-1.6 GM/177ML PO SOLN: 1.0000 | ORAL | 0 refills | Status: DC

## 2018-05-18 NOTE — Telephone Encounter (Signed)
Pre-op scheduled for 07/30/18 at 10:00am. LMOVM. Letter mailed.

## 2018-05-18 NOTE — Telephone Encounter (Signed)
lmovm

## 2018-05-18 NOTE — Telephone Encounter (Signed)
Please call patient, she has a question about her tcs prep price.  [phone]

## 2018-05-19 ENCOUNTER — Other Ambulatory Visit: Payer: Self-pay

## 2018-05-19 DIAGNOSIS — D509 Iron deficiency anemia, unspecified: Secondary | ICD-10-CM

## 2018-05-19 LAB — CBC WITH DIFFERENTIAL/PLATELET
BASOS ABS: 39 {cells}/uL (ref 0–200)
BASOS PCT: 0.5 %
EOS PCT: 2.3 %
Eosinophils Absolute: 177 cells/uL (ref 15–500)
HCT: 32.1 % — ABNORMAL LOW (ref 35.0–45.0)
Hemoglobin: 10 g/dL — ABNORMAL LOW (ref 11.7–15.5)
Lymphs Abs: 2094 cells/uL (ref 850–3900)
MCH: 23.9 pg — ABNORMAL LOW (ref 27.0–33.0)
MCHC: 31.2 g/dL — ABNORMAL LOW (ref 32.0–36.0)
MCV: 76.6 fL — AB (ref 80.0–100.0)
MONOS PCT: 6.5 %
MPV: 12.8 fL — ABNORMAL HIGH (ref 7.5–12.5)
NEUTROS PCT: 63.5 %
Neutro Abs: 4890 cells/uL (ref 1500–7800)
PLATELETS: 360 10*3/uL (ref 140–400)
RBC: 4.19 10*6/uL (ref 3.80–5.10)
RDW: 14.3 % (ref 11.0–15.0)
Total Lymphocyte: 27.2 %
WBC mixed population: 501 cells/uL (ref 200–950)
WBC: 7.7 10*3/uL (ref 3.8–10.8)

## 2018-05-19 LAB — IGA: IMMUNOGLOBULIN A: 148 mg/dL (ref 20–320)

## 2018-05-19 LAB — TSH: TSH: 46.61 m[IU]/L — AB (ref 0.40–4.50)

## 2018-05-19 LAB — TISSUE TRANSGLUTAMINASE, IGA: (tTG) Ab, IgA: 1 U/mL

## 2018-05-19 NOTE — Progress Notes (Signed)
cc'd to pcp 

## 2018-05-19 NOTE — Telephone Encounter (Signed)
LMOVM

## 2018-05-19 NOTE — Telephone Encounter (Signed)
Spoke with patient. She states she did not have the money right now to pick up her prep. I advised her to let her pharmacy know to put it on file and she can pick up when she is able as long as it is prior to 08/05/18. She voiced understanding.

## 2018-05-20 ENCOUNTER — Other Ambulatory Visit: Payer: Self-pay

## 2018-05-20 DIAGNOSIS — K529 Noninfective gastroenteritis and colitis, unspecified: Secondary | ICD-10-CM

## 2018-05-20 DIAGNOSIS — R634 Abnormal weight loss: Secondary | ICD-10-CM

## 2018-05-20 DIAGNOSIS — R195 Other fecal abnormalities: Secondary | ICD-10-CM

## 2018-05-20 DIAGNOSIS — D509 Iron deficiency anemia, unspecified: Secondary | ICD-10-CM

## 2018-05-21 ENCOUNTER — Other Ambulatory Visit: Payer: Self-pay

## 2018-05-21 DIAGNOSIS — D509 Iron deficiency anemia, unspecified: Secondary | ICD-10-CM

## 2018-05-25 ENCOUNTER — Other Ambulatory Visit: Payer: Self-pay | Admitting: Family Medicine

## 2018-05-25 ENCOUNTER — Telehealth: Payer: Self-pay | Admitting: Family Medicine

## 2018-05-25 MED ORDER — LEVOTHYROXINE SODIUM 88 MCG PO TABS: 88.0000 ug | ORAL_TABLET | Freq: Every day | ORAL | 1 refills | Status: DC

## 2018-05-25 NOTE — Telephone Encounter (Signed)
Per Dr Darnell Level increase Levothyroxine to 73mcg pt also needs follow up appt 6 wk after increasing dose Pt notified of increase and verbalizes understanding Pt will call back to schedule appt RX sent to pharmacy

## 2018-05-25 NOTE — Telephone Encounter (Signed)
Her weight based dose is around 50mcg.  I will have her increase to 9mcg daily and get rechecked in 6 weeks if she has not established with a new provider by that time.

## 2018-05-25 NOTE — Telephone Encounter (Signed)
Pt notified of recommendation and RX dose change

## 2018-05-27 ENCOUNTER — Ambulatory Visit: Payer: Medicare Other | Admitting: Family Medicine

## 2018-05-30 ENCOUNTER — Other Ambulatory Visit: Payer: Self-pay | Admitting: Family Medicine

## 2018-06-15 ENCOUNTER — Telehealth: Payer: Self-pay | Admitting: *Deleted

## 2018-06-15 NOTE — Telephone Encounter (Signed)
VM left on first number 2nd number does not have VM setup Pt needs an appt w/ Ronnie Doss, DO for F2F for O2 renewal

## 2018-06-18 NOTE — Telephone Encounter (Signed)
Pt has appt with Dr Lajuana Ripple 1/21 will close encounter.

## 2018-07-27 NOTE — Patient Instructions (Signed)
[name]  07/27/2018     @PREFPERIOPPHARMACY @   Your procedure is scheduled on  08/06/2018 .  Report to [facility] at  1245  P.M.  Call this number if you have problems the morning of surgery:  [phone]   Remember:  Follow the diet and prep instructions given to you by Dr Roseanne Kaufman office.                       Take these medicines the morning of surgery with A SIP OF WATER  Levothyroxine, lisinopril, metoprolol. Use your inhaler and your nebulizer before you come.    Do not wear jewelry, make-up or nail polish.  Do not wear lotions, powders, or perfumes, or deodorant.  Do not shave 48 hours prior to surgery.  Men may shave face and neck.  Do not bring valuables to the hospital.  [facility] is not responsible for any belongings or valuables.  Contacts, dentures or bridgework may not be worn into surgery.  Leave your suitcase in the car.  After surgery it may be brought to your room.  For patients admitted to the hospital, discharge time will be determined by your treatment team.  Patients discharged the day of surgery will not be allowed to drive home.   Name and phone number of your driver:   family Special instructions:  None  Please read over the following fact sheets that you were given. Anesthesia Post-op Instructions and Care and Recovery After Surgery       Upper Endoscopy, Adult Upper endoscopy is a procedure to look inside the upper GI (gastrointestinal) tract. The upper GI tract is made up of:  The part of the body that moves food from your mouth to your stomach (esophagus).  The stomach.  The first part of your small intestine (duodenum). This procedure is also called esophagogastroduodenoscopy (EGD) or gastroscopy. In this procedure, your health care provider passes a thin, flexible tube (endoscope) through your mouth and down your esophagus into your stomach. A small camera is attached to the end of the tube. Images from  the camera appear on a monitor in the exam room. During this procedure, your health care provider may also remove a small piece of tissue to be sent to a lab and examined under a microscope (biopsy). Your health care provider may do an upper endoscopy to diagnose cancers of the upper GI tract. You may also have this procedure to find the cause of other conditions, such as:  Stomach pain.  Heartburn.  Pain or problems when swallowing.  Nausea and vomiting.  Stomach bleeding.  Stomach ulcers. Tell a health care provider about:  Any allergies you have.  All medicines you are taking, including vitamins, herbs, eye drops, creams, and over-the-counter medicines.  Any problems you or family members have had with anesthetic medicines.  Any blood disorders you have.  Any surgeries you have had.  Any medical conditions you have.  Whether you are pregnant or may be pregnant. What are the risks? Generally, this is a safe procedure. However, problems may occur, including:  Infection.  Bleeding.  Allergic reactions to medicines.  A tear or hole (perforation) in the esophagus, stomach, or duodenum. What happens before the procedure? Staying hydrated Follow instructions from your health care provider about hydration, which may include:  Up to 2 hours before the procedure - you may continue to  drink clear liquids, such as water, clear fruit juice, black coffee, and plain tea.  Eating and drinking restrictions Follow instructions from your health care provider about eating and drinking, which may include:  8 hours before the procedure - stop eating heavy meals or foods, such as meat, fried foods, or fatty foods.  6 hours before the procedure - stop eating light meals or foods, such as toast or cereal.  6 hours before the procedure - stop drinking milk or drinks that contain milk.  2 hours before the procedure - stop drinking clear liquids. Medicines Ask your health care provider  about:  Changing or stopping your regular medicines. This is especially important if you are taking diabetes medicines or blood thinners.  Taking medicines such as aspirin and ibuprofen. These medicines can thin your blood. Do not take these medicines unless your health care provider tells you to take them.  Taking over-the-counter medicines, vitamins, herbs, and supplements. General instructions  Plan to have someone take you home from the hospital or clinic.  If you will be going home right after the procedure, plan to have someone with you for 24 hours.  Ask your health care provider what steps will be taken to help prevent infection. What happens during the procedure?   An IV will be inserted into one of your veins.  You may be given one or more of the following: ? A medicine to help you relax (sedative). ? A medicine to numb the throat (local anesthetic).  You will lie on your left side on an exam table.  Your health care provider will pass the endoscope through your mouth and down your esophagus.  Your health care provider will use the scope to check the inside of your esophagus, stomach, and duodenum. Biopsies may be taken.  The endoscope will be removed. The procedure may vary among health care providers and hospitals. What happens after the procedure?  Your blood pressure, heart rate, breathing rate, and blood oxygen level will be monitored until you leave the hospital or clinic.  Do not drive for 24 hours if you were given a sedative during your procedure.  When your throat is no longer numb, you may be given some fluids to drink.  It is up to you to get the results of your procedure. Ask your health care provider, or the department that is doing the procedure, when your results will be ready. Summary  Upper endoscopy is a procedure to look inside the upper GI tract.  During the procedure, an IV will be inserted into one of your veins. You may be given a medicine  to help you relax.  A medicine will be used to numb your throat.  The endoscope will be passed through your mouth and down your esophagus. This information is not intended to replace advice given to you by your health care provider. Make sure you discuss any questions you have with your health care provider. Document Released: 06/21/2000 Document Revised: 11/24/2017 Document Reviewed: 11/24/2017 Elsevier Interactive Patient Education  2019 [facility], Adult, Care After This sheet gives you information about how to care for yourself after your procedure. Your health care provider may also give you more specific instructions. If you have problems or questions, contact your health care provider. What can I expect after the procedure? After the procedure, it is common to have:  A sore throat.  Mild stomach pain or discomfort.  Bloating.  Nausea. Follow these instructions at home:  Follow instructions from your health care provider about what to eat or drink after your procedure.  Return to your normal activities as told by your health care provider. Ask your health care provider what activities are safe for you.  Take over-the-counter and prescription medicines only as told by your health care provider.  Do not drive for 24 hours if you were given a sedative during your procedure.  Keep all follow-up visits as told by your health care provider. This is important. Contact a health care provider if you have:  A sore throat that lasts longer than one day.  Trouble swallowing. Get help right away if:  You vomit blood or your vomit looks like coffee grounds.  You have: ? A fever. ? Bloody, black, or tarry stools. ? A severe sore throat or you cannot swallow. ? Difficulty breathing. ? Severe pain in your chest or abdomen. Summary  After the procedure, it is common to have a sore throat, mild stomach discomfort, bloating, and nausea.  Do not drive for 24  hours if you were given a sedative during the procedure.  Follow instructions from your health care provider about what to eat or drink after your procedure.  Return to your normal activities as told by your health care provider. This information is not intended to replace advice given to you by your health care provider. Make sure you discuss any questions you have with your health care provider. Document Released: 12/24/2011 Document Revised: 11/24/2017 Document Reviewed: 11/24/2017 Elsevier Interactive Patient Education  2019 [employer].  Colonoscopy, Adult A colonoscopy is an exam to look at the large intestine. It is done to check for problems, such as:  Lumps (tumors).  Growths (polyps).  Swelling (inflammation).  Bleeding. What happens before the procedure? Eating and drinking Follow instructions from your doctor about eating and drinking. These instructions may include:  A few days before the procedure - follow a low-fiber diet. ? Avoid nuts. ? Avoid seeds. ? Avoid dried fruit. ? Avoid raw fruits. ? Avoid vegetables.  1-3 days before the procedure - follow a clear liquid diet. Avoid liquids that have red or purple dye. Drink only clear liquids, such as: ? Clear broth or bouillon. ? Black coffee or tea. ? Clear juice. ? Clear soft drinks or sports drinks. ? Gelatin dessert. ? Popsicles.  On the day of the procedure - do not eat or drink anything during the 2 hours before the procedure. Up to 2 hours before the procedure, you may continue to drink clear liquids, such as water or clear fruit juice.  Bowel prep If you were prescribed an oral bowel prep:  Take it as told by your doctor. Starting the day before your procedure, you will need to drink a lot of liquid. The liquid will cause you to poop (have bowel movements) until your poop is almost clear or light green.  To clean out your colon, you may also be given: ? Laxative medicines. ? Instructions about how  to use an enema.  If your skin or butt gets irritated from diarrhea, you may: ? Wipe the area with wipes that have medicine in them, such as adult wet wipes with aloe and vitamin E. ? Put something on your skin that soothes the area, such as petroleum jelly.  If you throw up (vomit) while drinking the bowel prep, take a break for up to 60 minutes. Then begin the bowel prep again. If you keep throwing up and you cannot take  the bowel prep without throwing up, call your doctor. General instructions  Ask your doctor about: ? Changing or stopping your normal medicines. This is important if you take iron pills, diabetes medicines, or blood thinners. ? Taking medicines such as aspirin and ibuprofen. These medicines can thin your blood. Do not take these medicines unless your doctor tells you to take them.  Plan to have someone take you home from the hospital or clinic. What happens during the procedure?   An IV tube may be put into one of your veins.  You will be given medicine to help you relax (sedative).  To reduce your risk of infection: ? Your doctors will wash their hands. ? Your anal area will be washed with soap.  You will be asked to lie on your side with your knees bent.  Your doctor will get a long, thin, flexible tube ready. The tube will have a camera and a light on the end.  The tube will be put into your anus.  The tube will be gently put into your large intestine.  Air will be delivered into your large intestine to keep it open. You may feel some pressure or cramping.  The camera will be used to take photos.  A small tissue sample may be removed for testing (biopsy).  If small growths are found, your doctor may remove them and have them checked for cancer.  The tube that was put into your anus will be slowly removed. The procedure may vary among doctors and hospitals. What happens after the procedure?  Your doctor will check on you often until the medicines you  were given have worn off.  Do not drive for 24 hours after the procedure.  You may have a small amount of blood in your poop.  You may pass gas.  You may have mild cramps or bloating in your belly (abdomen).  It is up to you to get the results of your procedure. Ask your doctor, or the department performing the procedure, when your results will be ready. Summary  A colonoscopy is an exam to look at the large intestine.  Follow instructions from your doctor about eating and drinking before the procedure.  If you were prescribed an oral bowel prep to clean out your colon, take it as told by your doctor.  Your doctor will check on you often until the medicines you were given have worn off.  Plan to have someone take you home from the hospital or clinic. This information is not intended to replace advice given to you by your health care provider. Make sure you discuss any questions you have with your health care provider. Document Released: 07/27/2010 Document Revised: 04/23/2017 Document Reviewed: 09/05/2015 Elsevier Interactive Patient Education  2019 [employer]  Colonoscopy, Adult, Care After This sheet gives you information about how to care for yourself after your procedure. Your health care provider may also give you more specific instructions. If you have problems or questions, contact your health care provider. What can I expect after the procedure? After the procedure, it is common to have:  A small amount of blood in your stool for 24 hours after the procedure.  Some gas.  Mild abdominal cramping or bloating. Follow these instructions at home: General instructions  For the first 24 hours after the procedure: ? Do not drive or use machinery. ? Do not sign important documents. ? Do not drink alcohol. ? Do your regular daily activities at a slower pace  than normal. ? Eat soft, easy-to-digest foods.  Take over-the-counter or prescription medicines only as told by  your health care provider. Relieving cramping and bloating   Try walking around when you have cramps or feel bloated.  Apply heat to your abdomen as told by your health care provider. Use a heat source that your health care provider recommends, such as a moist heat pack or a heating pad. ? Place a towel between your skin and the heat source. ? Leave the heat on for 20-30 minutes. ? Remove the heat if your skin turns bright red. This is especially important if you are unable to feel pain, heat, or cold. You may have a greater risk of getting burned. Eating and drinking   Drink enough fluid to keep your urine pale yellow.  Resume your normal diet as instructed by your health care provider. Avoid heavy or fried foods that are hard to digest.  Avoid drinking alcohol for as long as instructed by your health care provider. Contact a health care provider if:  You have blood in your stool 2-3 days after the procedure. Get help right away if:  You have more than a small spotting of blood in your stool.  You pass large blood clots in your stool.  Your abdomen is swollen.  You have nausea or vomiting.  You have a fever.  You have increasing abdominal pain that is not relieved with medicine. Summary  After the procedure, it is common to have a small amount of blood in your stool. You may also have mild abdominal cramping and bloating.  For the first 24 hours after the procedure, do not drive or use machinery, sign important documents, or drink alcohol.  Contact your health care provider if you have a lot of blood in your stool, nausea or vomiting, a fever, or increased abdominal pain. This information is not intended to replace advice given to you by your health care provider. Make sure you discuss any questions you have with your health care provider. Document Released: 02/06/2004 Document Revised: 04/16/2017 Document Reviewed: 09/05/2015 Elsevier Interactive Patient Education  2019  [employer] Anesthesia is a term that refers to techniques, procedures, and medicines that help a person stay safe and comfortable during a medical procedure. Monitored anesthesia care, or sedation, is one type of anesthesia. Your anesthesia specialist may recommend sedation if you will be having a procedure that does not require you to be unconscious, such as:  Cataract surgery.  A dental procedure.  A biopsy.  A colonoscopy. During the procedure, you may receive a medicine to help you relax (sedative). There are three levels of sedation:  Mild sedation. At this level, you may feel awake and relaxed. You will be able to follow directions.  Moderate sedation. At this level, you will be sleepy. You may not remember the procedure.  Deep sedation. At this level, you will be asleep. You will not remember the procedure. The more medicine you are given, the deeper your level of sedation will be. Depending on how you respond to the procedure, the anesthesia specialist may change your level of sedation or the type of anesthesia to fit your needs. An anesthesia specialist will monitor you closely during the procedure. Let your health care provider know about:  Any allergies you have.  All medicines you are taking, including vitamins, herbs, eye drops, creams, and over-the-counter medicines.  Any use of steroids (by mouth or as a cream).  Any problems  you or family members have had with sedatives and anesthetic medicines.  Any blood disorders you have.  Any surgeries you have had.  Any medical conditions you have, such as sleep apnea.  Whether you are pregnant or may be pregnant.  Any use of cigarettes, alcohol, or street drugs. What are the risks? Generally, this is a safe procedure. However, problems may occur, including:  Getting too much medicine (oversedation).  Nausea.  Allergic reaction to medicines.  Trouble breathing. If this happens, a  breathing tube may be used to help with breathing. It will be removed when you are awake and breathing on your own.  Heart trouble.  Lung trouble. Before the procedure Staying hydrated Follow instructions from your health care provider about hydration, which may include:  Up to 2 hours before the procedure - you may continue to drink clear liquids, such as water, clear fruit juice, black coffee, and plain tea. Eating and drinking restrictions Follow instructions from your health care provider about eating and drinking, which may include:  8 hours before the procedure - stop eating heavy meals or foods such as meat, fried foods, or fatty foods.  6 hours before the procedure - stop eating light meals or foods, such as toast or cereal.  6 hours before the procedure - stop drinking milk or drinks that contain milk.  2 hours before the procedure - stop drinking clear liquids. Medicines Ask your health care provider about:  Changing or stopping your regular medicines. This is especially important if you are taking diabetes medicines or blood thinners.  Taking medicines such as aspirin and ibuprofen. These medicines can thin your blood. Do not take these medicines before your procedure if your health care provider instructs you not to. Tests and exams  You will have a physical exam.  You may have blood tests done to show: ? How well your kidneys and liver are working. ? How well your blood can clot. General instructions  Plan to have someone take you home from the hospital or clinic.  If you will be going home right after the procedure, plan to have someone with you for 24 hours.  What happens during the procedure?  Your blood pressure, heart rate, breathing, level of pain and overall condition will be monitored.  An IV tube will be inserted into one of your veins.  Your anesthesia specialist will give you medicines as needed to keep you comfortable during the procedure. This  may mean changing the level of sedation.  The procedure will be performed. After the procedure  Your blood pressure, heart rate, breathing rate, and blood oxygen level will be monitored until the medicines you were given have worn off.  Do not drive for 24 hours if you received a sedative.  You may: ? Feel sleepy, clumsy, or nauseous. ? Feel forgetful about what happened after the procedure. ? Have a sore throat if you had a breathing tube during the procedure. ? Vomit. This information is not intended to replace advice given to you by your health care provider. Make sure you discuss any questions you have with your health care provider. Document Released: 03/20/2005 Document Revised: 12/01/2015 Document Reviewed: 10/15/2015 Elsevier Interactive Patient Education  2019 [place], Care After These instructions provide you with information about caring for yourself after your procedure. Your health care provider may also give you more specific instructions. Your treatment has been planned according to current medical practices, but problems sometimes occur. Call your  health care provider if you have any problems or questions after your procedure. What can I expect after the procedure? After your procedure, you may:  Feel sleepy for several hours.  Feel clumsy and have poor balance for several hours.  Feel forgetful about what happened after the procedure.  Have poor judgment for several hours.  Feel nauseous or vomit.  Have a sore throat if you had a breathing tube during the procedure. Follow these instructions at home: For at least 24 hours after the procedure:      Have a responsible adult stay with you. It is important to have someone help care for you until you are awake and alert.  Rest as needed.  Do not: ? Participate in activities in which you could fall or become injured. ? Drive. ? Use heavy machinery. ? Drink alcohol. ? Take  sleeping pills or medicines that cause drowsiness. ? Make important decisions or sign legal documents. ? Take care of children on your own. Eating and drinking  Follow the diet that is recommended by your health care provider.  If you vomit, drink water, juice, or soup when you can drink without vomiting.  Make sure you have little or no nausea before eating solid foods. General instructions  Take over-the-counter and prescription medicines only as told by your health care provider.  If you have sleep apnea, surgery and certain medicines can increase your risk for breathing problems. Follow instructions from your health care provider about wearing your sleep device: ? Anytime you are sleeping, including during daytime naps. ? While taking prescription pain medicines, sleeping medicines, or medicines that make you drowsy.  If you smoke, do not smoke without supervision.  Keep all follow-up visits as told by your health care provider. This is important. Contact a health care provider if:  You keep feeling nauseous or you keep vomiting.  You feel light-headed.  You develop a rash.  You have a fever. Get help right away if:  You have trouble breathing. Summary  For several hours after your procedure, you may feel sleepy and have poor judgment.  Have a responsible adult stay with you for at least 24 hours or until you are awake and alert. This information is not intended to replace advice given to you by your health care provider. Make sure you discuss any questions you have with your health care provider. Document Released: 10/15/2015 Document Revised: 02/07/2017 Document Reviewed: 10/15/2015 Elsevier Interactive Patient Education  2019 [employer].

## 2018-07-28 ENCOUNTER — Telehealth: Payer: Self-pay | Admitting: Internal Medicine

## 2018-07-28 ENCOUNTER — Ambulatory Visit: Payer: Medicare Other | Admitting: Family Medicine

## 2018-07-28 MED ORDER — PEG 3350-KCL-NA BICARB-NACL 420 G PO SOLR: 4000.0000 mL | Freq: Once | ORAL | 0 refills | Status: AC

## 2018-07-28 NOTE — Telephone Encounter (Signed)
Spoke with patient and she stated she suprep was going to cost her $104. She wants something cheaper. I have sent trylite in for her. Patient will stop by the office on 07/30/2018 day of pre-op to p/u instructions.

## 2018-07-28 NOTE — Telephone Encounter (Signed)
Pt said her prep was too expensive and was there anything else cheaper. She uses [facility] in [place] and her procedure is with [facility] on 1/30. Please advise [phone]

## 2018-07-30 ENCOUNTER — Encounter (HOSPITAL_COMMUNITY): Payer: Self-pay

## 2018-07-30 ENCOUNTER — Encounter: Payer: Self-pay | Admitting: Family Medicine

## 2018-07-30 ENCOUNTER — Other Ambulatory Visit: Payer: Self-pay

## 2018-07-30 ENCOUNTER — Ambulatory Visit (HOSPITAL_COMMUNITY)
Admission: RE | Admit: 2018-07-30 | Discharge: 2018-07-30 | Disposition: A | Discharge status: Home / Self Care | Payer: Medicare Other | Source: Ambulatory Visit | Attending: Internal Medicine | Admitting: Internal Medicine

## 2018-07-30 ENCOUNTER — Other Ambulatory Visit (HOSPITAL_COMMUNITY): Payer: Medicare Other

## 2018-07-30 DIAGNOSIS — Z01812 Encounter for preprocedural laboratory examination: Secondary | ICD-10-CM | POA: Insufficient documentation

## 2018-07-30 DIAGNOSIS — H35342 Macular cyst, hole, or pseudohole, left eye: Secondary | ICD-10-CM | POA: Diagnosis not present

## 2018-07-30 DIAGNOSIS — H4440 Unspecified hypotony of eye: Secondary | ICD-10-CM | POA: Diagnosis not present

## 2018-07-30 DIAGNOSIS — H33022 Retinal detachment with multiple breaks, left eye: Secondary | ICD-10-CM | POA: Diagnosis not present

## 2018-07-30 DIAGNOSIS — H2702 Aphakia, left eye: Secondary | ICD-10-CM | POA: Insufficient documentation

## 2018-07-30 HISTORY — DX: Chronic obstructive pulmonary disease, unspecified: J44.9

## 2018-07-30 LAB — BASIC METABOLIC PANEL
Anion gap: 11 (ref 5–15)
BUN: 35 mg/dL — ABNORMAL HIGH (ref 8–23)
CO2: 21 mmol/L — AB (ref 22–32)
Calcium: 8.7 mg/dL — ABNORMAL LOW (ref 8.9–10.3)
Chloride: 109 mmol/L (ref 98–111)
Creatinine, Ser: 1.12 mg/dL — ABNORMAL HIGH (ref 0.44–1.00)
GFR calc Af Amer: 52 mL/min — ABNORMAL LOW (ref >60)
GFR calc non Af Amer: 45 mL/min — ABNORMAL LOW (ref >60)
Glucose, Bld: 96 mg/dL (ref 70–99)
Potassium: 3.4 mmol/L — ABNORMAL LOW (ref 3.5–5.1)
Sodium: 141 mmol/L (ref 135–145)

## 2018-07-30 LAB — CBC
HEMATOCRIT: 31.6 % — AB (ref 36.0–46.0)
HEMOGLOBIN: 9 g/dL — AB (ref 12.0–15.0)
MCH: 23 pg — ABNORMAL LOW (ref 26.0–34.0)
MCHC: 28.5 g/dL — ABNORMAL LOW (ref 30.0–36.0)
MCV: 80.6 fL (ref 80.0–100.0)
Platelets: 354 10*3/uL (ref 150–400)
RBC: 3.92 MIL/uL (ref 3.87–5.11)
RDW: 16.6 % — ABNORMAL HIGH (ref 11.5–15.5)
WBC: 6.2 10*3/uL (ref 4.0–10.5)
nRBC: 0 % (ref 0.0–0.2)

## 2018-08-03 ENCOUNTER — Telehealth: Payer: Self-pay | Admitting: *Deleted

## 2018-08-03 NOTE — Telephone Encounter (Signed)
Received VM patient wants to cancel TCS scheduled for 08/06/2018. Looks like patient already called hospital and cancelled. LMTCB for pt to see if she wants to r/s. FYI to LSL.

## 2018-08-03 NOTE — Telephone Encounter (Signed)
noted 

## 2018-08-06 ENCOUNTER — Encounter (HOSPITAL_COMMUNITY): Payer: Self-pay

## 2018-08-06 ENCOUNTER — Encounter (HOSPITAL_COMMUNITY): Admission: RE | Payer: Self-pay | Source: Home / Self Care

## 2018-08-06 ENCOUNTER — Ambulatory Visit (HOSPITAL_COMMUNITY): Admission: RE | Admit: 2018-08-06 | Payer: Medicare Other | Source: Home / Self Care | Admitting: Internal Medicine

## 2018-08-06 ENCOUNTER — Ambulatory Visit (HOSPITAL_COMMUNITY): Admit: 2018-08-06 | Payer: Medicare Other | Admitting: Internal Medicine

## 2018-08-06 SURGERY — COLONOSCOPY WITH PROPOFOL
Anesthesia: Monitor Anesthesia Care

## 2019-02-05 ENCOUNTER — Other Ambulatory Visit (HOSPITAL_COMMUNITY): Payer: Self-pay | Admitting: Oncology

## 2019-02-05 ENCOUNTER — Other Ambulatory Visit: Payer: Self-pay | Admitting: Oncology

## 2019-02-05 DIAGNOSIS — C20 Malignant neoplasm of rectum: Secondary | ICD-10-CM

## 2019-02-12 ENCOUNTER — Ambulatory Visit (HOSPITAL_COMMUNITY)
Admission: RE | Admit: 2019-02-12 | Discharge: 2019-02-12 | Disposition: A | Discharge status: Home / Self Care | Payer: Medicare Other | Source: Ambulatory Visit | Attending: Oncology | Admitting: Oncology

## 2019-02-12 ENCOUNTER — Other Ambulatory Visit: Payer: Self-pay

## 2019-02-12 DIAGNOSIS — Z79890 Hormone replacement therapy (postmenopausal): Secondary | ICD-10-CM | POA: Insufficient documentation

## 2019-02-12 DIAGNOSIS — E782 Mixed hyperlipidemia: Secondary | ICD-10-CM | POA: Diagnosis not present

## 2019-02-12 DIAGNOSIS — Z951 Presence of aortocoronary bypass graft: Secondary | ICD-10-CM | POA: Diagnosis not present

## 2019-02-12 DIAGNOSIS — I2510 Atherosclerotic heart disease of native coronary artery without angina pectoris: Secondary | ICD-10-CM | POA: Insufficient documentation

## 2019-02-12 DIAGNOSIS — Z79899 Other long term (current) drug therapy: Secondary | ICD-10-CM | POA: Insufficient documentation

## 2019-02-12 DIAGNOSIS — C20 Malignant neoplasm of rectum: Secondary | ICD-10-CM | POA: Diagnosis not present

## 2019-02-12 DIAGNOSIS — Z7982 Long term (current) use of aspirin: Secondary | ICD-10-CM | POA: Insufficient documentation

## 2019-02-12 DIAGNOSIS — E039 Hypothyroidism, unspecified: Secondary | ICD-10-CM | POA: Diagnosis not present

## 2019-02-12 DIAGNOSIS — N200 Calculus of kidney: Secondary | ICD-10-CM | POA: Diagnosis not present

## 2019-02-12 DIAGNOSIS — Z87891 Personal history of nicotine dependence: Secondary | ICD-10-CM | POA: Insufficient documentation

## 2019-02-12 DIAGNOSIS — I10 Essential (primary) hypertension: Secondary | ICD-10-CM | POA: Diagnosis not present

## 2019-02-12 DIAGNOSIS — R918 Other nonspecific abnormal finding of lung field: Secondary | ICD-10-CM | POA: Diagnosis not present

## 2019-02-12 LAB — GLUCOSE, CAPILLARY: Glucose-Capillary: 81 mg/dL (ref 70–99)

## 2019-02-12 MED ORDER — FLUDEOXYGLUCOSE F - 18 (FDG) INJECTION
4.9900 | Freq: Once | INTRAVENOUS | Status: AC | PRN
  Administered 2019-02-12: 4.99 via INTRAVENOUS

## 2019-10-30 IMAGING — PT NUCLEAR MEDICINE PET IMAGE INITIAL (PI) SKULL BASE TO THIGH
1 of 8 series · 3 of 16 positions shown, 4 images · non-contrast
Comparison: CT AP 01/15/2019

CLINICAL DATA: Initial treatment strategy for rectal cancer.

EXAM:
NUCLEAR MEDICINE PET SKULL BASE TO THIGH
TECHNIQUE: 4.9 mCi F-18 FDG was injected intravenously. Full-ring PET imaging
was performed from the skull base to thigh after the radiotracer. CT
data was obtained and used for attenuation correction and anatomic
localization.
Fasting blood glucose: 81 mg/dl

[Series 4: ct sk_thigh 5.0 b31f · axial · 0.98mm/px · z∈[-783,-51]mm · 3 of 184 slices shown, 4 images]
[im 1/184  soft-tissue]
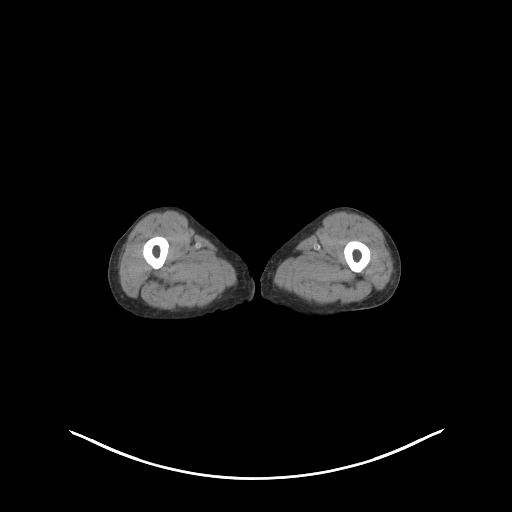
[im 1/184  bone]
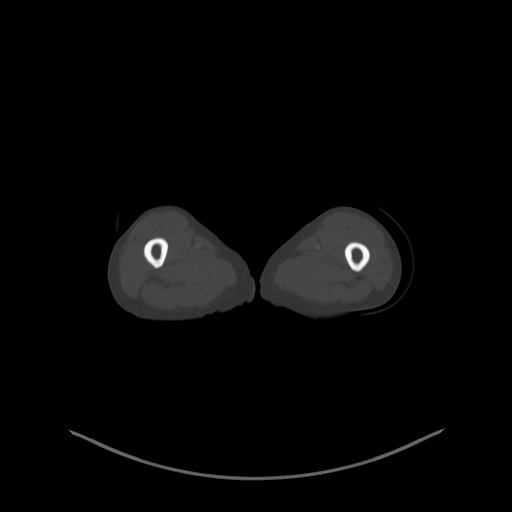
[im 92/184  soft-tissue]
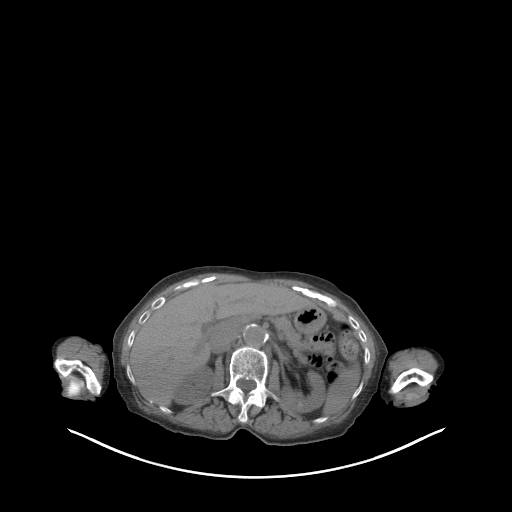
[im 184/184  soft-tissue]
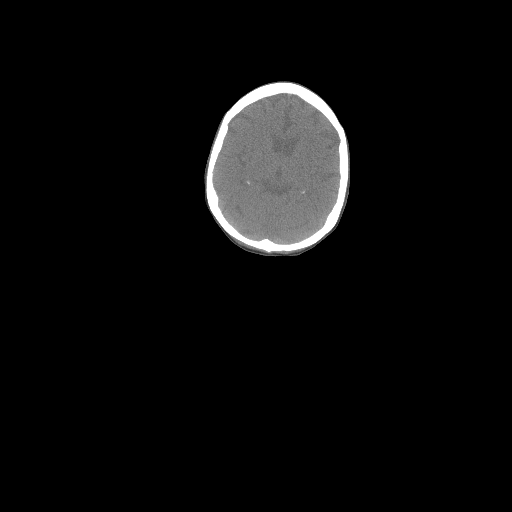

[3 of 16 positions shown; findings below may reference images not displayed]

FINDINGS: Mediastinal blood pool activity: SUV max

Liver activity: SUV max NA

NECK: No hypermetabolic lymph nodes in the neck.

Incidental CT findings: none

CHEST: No hypermetabolic axillary or supraclavicular lymph nodes. No
hypermetabolic mediastinal or hilar lymph nodes. No FDG avid
pulmonary nodules. No pleural effusion.

Incidental CT findings: Status post CABG. Aortic atherosclerosis.
Emphysema. Tiny nonspecific nodule in the left upper lobe measures 2
mm and is too small to reliably characterize by PET-CT, image [DATE].
3 mm left lower lobe lung nodule is identified. Also too small to
characterize by PET-CT, image 56/4.

ABDOMEN/PELVIS: No abnormal hypermetabolic activity within the
liver, pancreas, adrenal glands, or spleen. No hypermetabolic lymph
nodes in the abdomen or pelvis.

Apple-core lesion involving the rectum is intensely hypermetabolic.
This measures 5.4 x 5.0 by 5.1 cm and has SUV max of 35.

Incidental CT findings: Aortic atherosclerosis. Stone in the right
renal pelvis measures 9 mm. Slightly more distally closer to the UPJ
is a 1.1 cm stone, image 107/4. No significant right hydronephrosis.
Hyperdense left mid kidney lesion measures 1.7 cm. Incompletely
characterized without IV contrast. Mild increased FDG uptake within
this lesion is suspected and when compared with CT from 01/15/2019
with today's unenhanced CT there appears to be enhancement within
this structure.

SKELETON: No focal hypermetabolic activity to suggest skeletal
metastasis.

Incidental CT findings: none
IMPRESSION: 1. Intense abnormal increased FDG uptake associated with rectal
mass.
2. No FDG avid nodal metastasis or solid organ metastasis is
identified.
3. There is a hyperdense FDG avid lesion within the inferior pole of
left kidney. Recommend further evaluation with contrast enhanced
renal protocol MRI to assess for underlying renal cell carcinoma.
4. Right renal pelvis calculi.
5. Small, nonspecific milli metric lung nodules are too small to
characterize by PET-CT

## 2022-12-30 LAB — TSH: TSH: 100 — AB (ref 0.41–5.90)

## 2023-02-06 ENCOUNTER — Encounter: Payer: Self-pay | Admitting: Internal Medicine

## 2023-02-06 ENCOUNTER — Ambulatory Visit: Payer: Medicare Other | Attending: Internal Medicine | Admitting: Internal Medicine

## 2023-02-06 ENCOUNTER — Telehealth: Payer: Self-pay

## 2023-02-06 VITALS — BP 132/70 | HR 66 | Ht 59.0 in | Wt 89.2 lb

## 2023-02-06 DIAGNOSIS — E039 Hypothyroidism, unspecified: Secondary | ICD-10-CM | POA: Diagnosis not present

## 2023-02-06 DIAGNOSIS — R9389 Abnormal findings on diagnostic imaging of other specified body structures: Secondary | ICD-10-CM | POA: Insufficient documentation

## 2023-02-06 DIAGNOSIS — R000 Tachycardia, unspecified: Secondary | ICD-10-CM | POA: Insufficient documentation

## 2023-02-06 DIAGNOSIS — I2510 Atherosclerotic heart disease of native coronary artery without angina pectoris: Secondary | ICD-10-CM | POA: Diagnosis not present

## 2023-02-06 DIAGNOSIS — R42 Dizziness and giddiness: Secondary | ICD-10-CM | POA: Diagnosis present

## 2023-02-06 DIAGNOSIS — I701 Atherosclerosis of renal artery: Secondary | ICD-10-CM | POA: Diagnosis present

## 2023-02-06 DIAGNOSIS — E785 Hyperlipidemia, unspecified: Secondary | ICD-10-CM | POA: Diagnosis not present

## 2023-02-06 MED ORDER — EZETIMIBE 10 MG PO TABS
10.0000 mg | ORAL_TABLET | Freq: Every day | ORAL | 1 refills | Status: AC
Start: 2023-02-06 — End: ?

## 2023-02-06 MED ORDER — ASPIRIN 81 MG PO TBEC: 81.0000 mg | DELAYED_RELEASE_TABLET | Freq: Every day | ORAL | 12 refills | Status: AC

## 2023-02-06 MED ORDER — LISINOPRIL 10 MG PO TABS: 10.0000 mg | ORAL_TABLET | Freq: Every day | ORAL | 1 refills | Status: DC

## 2023-02-06 NOTE — Telephone Encounter (Signed)
Added after patient left: Get [facility]G renal artery doppler too. Also can you start Zetia 10 mg once daily And obtain repeat lipid panel in 6 months.

## 2023-02-06 NOTE — Patient Instructions (Addendum)
Medication Instructions:  Your physician has recommended you make the following change in your medication:  Stop taking BC powder Stop taking lisinopril hydrochlorothiazide  Stop taking Metoprolol  Start taking Aspirin 81 mg daily Start taking Lisinopril 10 mg once a day Start taking Zetia 10 mg daily Continue taking all other medications as prescribed  Labwork: Lipids to be done in 6 months before follow up  Testing/Procedures: Your physician has requested that you have an echocardiogram. Echocardiography is a painless test that uses sound waves to create images of your heart. It provides your doctor with information about the size and shape of your heart and how well your heart's chambers and valves are working. This procedure takes approximately one hour. There are no restrictions for this procedure. Please do NOT wear cologne, perfume, aftershave, or lotions (deodorant is allowed). Please arrive 15 minutes prior to your appointment time.   Follow-Up: Your physician recommends that you schedule a follow-up appointment in: 6 months  Any Other Special Instructions Will Be Listed Below (If Applicable).  Referral to Endocrinology   If you need a refill on your cardiac medications before your next appointment, please call your pharmacy.

## 2023-02-06 NOTE — Progress Notes (Signed)
Cardiology Office Note  Date: 02/06/2023   ID: [name], DOB Aug 19, 1932, MRN [id number]  PCP:  Jarvis Morgan, DO  Cardiologist:  Marjo Bicker, MD Electrophysiologist:  None    History of Present Illness: [name] is a 89 y.o. female known to have CAD manifested by NSTEMI in 2008 s/p CABG with normal LVEF, left renal artery stenosis 50%, HTN, severe hypothyroidism, HLD, severe COPD was referred to cardiology clinic to establish care.  Patient has no symptoms, no angina or DOE.  Overall doing great.  No orthopnea, PND, leg swelling.  Takes BC and not aspirin 81 mg.  Has statin intolerance.  Currently not on any statins.  Not on Zetia.  Past Medical History:  Diagnosis Date   COPD (chronic obstructive pulmonary disease) (HCC)    COPD, severe    Question history of asthma   Coronary artery disease    NSTEMI/high grade left main disease   Essential hypertension    Hypothyroidism    Mixed hyperlipidemia    Peripheral vascular disease (HCC)    50% left RAS   RBBB (right bundle branch block)    Status post coronary artery bypass grafting 2008    Past Surgical History:  Procedure Laterality Date   CATARACT EXTRACTION Bilateral    CHOLECYSTECTOMY  2016   CORONARY ARTERY BYPASS GRAFT  3/08   Three-vessel: LIMA-LAD; SCG-OM1; SVG-RCA; normal LV function   EYE SURGERY Left    detatched retina    Current Outpatient Medications  Medication Sig Dispense Refill   aspirin EC 81 MG tablet Take 1 tablet (81 mg total) by mouth daily. Swallow whole. 30 tablet 12   Carboxymethylcellulose Sodium (THERATEARS) 0.25 % SOLN Place 1 drop into the right eye at bedtime.     fluticasone furoate-vilanterol (BREO ELLIPTA) 100-25 MCG/ACT AEPB Inhale 1 puff into the lungs daily.     ipratropium-albuterol (DUONEB) 0.5-2.5 (3) MG/3ML SOLN USE ONE vial in nebulizer EVERY 6 HOURS AS NEEDED SHORTNESS OF BREATH (Patient taking differently: Take 3 mLs by nebulization every 6 (six) hours  as needed (for shortness of breath or wheezing).) 180 mL 1   levothyroxine (SYNTHROID) 25 MCG tablet Take 25 mcg by mouth daily before breakfast.     lisinopril (ZESTRIL) 10 MG tablet Take 1 tablet (10 mg total) by mouth daily. 90 tablet 1   nitroGLYCERIN (NITROSTAT) 0.4 MG SL tablet Place 1 tablet (0.4 mg total) under the tongue every 5 (five) minutes x 3 doses as needed for chest pain (for severe chest pain. If no relief after 3rd dose, proceed to the ED for an evaluation). 25 tablet 3   VENTOLIN HFA 108 (90 Base) MCG/ACT inhaler INHALE 2 PUFFS INTO THE LUNGS EVERY 4 (FOUR) HOURS AS NEEDED FOR WHEEZING OR SHORTNESS OF BREATH. (Patient taking differently: Inhale 2 puffs into the lungs every 4 (four) hours as needed for wheezing or shortness of breath.) 36 Inhaler 2   No current facility-administered medications for this visit.   Allergies:  Prednisone, Ciprofloxacin, and Codeine   Social History: The patient  reports that she quit smoking about 16 years ago. Her smoking use included cigarettes. She started smoking about 71 years ago. She has a 44 pack-year smoking history. She has never used smokeless tobacco. She reports that she does not drink alcohol and does not use drugs.   Family History: The patient's family history is not on file.   ROS:  Please see the history of present illness. Otherwise,  complete review of systems is positive for none  All other systems are reviewed and negative.   Physical Exam: VS:  BP 132/70   Pulse 66   Ht 4\' 11"  (1.499 m)   Wt 89 lb 3.2 oz (40.5 kg)   SpO2 94%   BMI 18.02 kg/m , BMI Body mass index is 18.02 kg/m.  Wt Readings from Last 3 Encounters:  02/06/23 89 lb 3.2 oz (40.5 kg)  07/30/18 88 lb (39.9 kg)  05/15/18 89 lb 9.6 oz (40.6 kg)    General: Patient appears comfortable at rest. HEENT: Conjunctiva and lids normal, oropharynx clear with moist mucosa. Neck: Supple, no elevated JVP or carotid bruits, no thyromegaly. Lungs: Clear to  auscultation, nonlabored breathing at rest. Cardiac: Regular rate and rhythm, no S3 or significant systolic murmur, no pericardial rub. Abdomen: Soft, nontender, no hepatomegaly, bowel sounds present, no guarding or rebound. Extremities: No pitting edema, distal pulses 2+. Skin: Warm and dry. Musculoskeletal: No kyphosis. Neuropsychiatric: Alert and oriented x3, affect grossly appropriate.  Recent Labwork: No results found for requested labs within last 365 days.     Component Value Date/Time   CHOL 210 (H) 11/30/2015 1237   TRIG 76 11/30/2015 1237   HDL 67 11/30/2015 1237   CHOLHDL 3.1 11/30/2015 1237   CHOLHDL 3.3 09/06/2013 1007   VLDL 13 09/06/2013 1007   LDLCALC 128 (H) 11/30/2015 1237   LDLDIRECT 139 (H) 10/22/2016 1423    Assessment and Plan:  # CAD s/p CABG (LIMA to LAD, SVG to OM1, SVG to RCA) in 2008 with normal LVEF, angina free -Start aspirin 81 mg once daily and discontinue BC -History of statin intolerance (tried simvastatin, pravastatin and pitavastatin with intolerance) -Stop metoprolol due to dizziness -Discontinue lisinopril-HCTZ combination and start lisinopril 10 mg once daily. -Obtain 2D echocardiogram  # HLD, not at goal # Statin intolerance -Need to obtain repeat lipid panel.  Goal LDL less than 70. Has history of statin intolerance in the past.  Start Zetia 10 mg once daily.  LDL 139 in 2018.  # Dizziness -Dizziness is likely secondary to left eye disease.  Will stop metoprolol.  # 50% L renal artery stenosis -Obtain USG renal artery Doppler  # Severe hypothyroidism -TSH 126.5 in 6/24.  Currently on levothyroxine 25 mcg which she is not tolerating (reaction feels like bugs crawling on her head).  Request endocrine referral in [place] and does not want to go to [facility].  # Severe COPD per records -Has stable SOB, no worsening.   I have spent a total of 45 minutes with patient reviewing chart, EKGs, labs and examining patient as well as  establishing an assessment and plan that was discussed with the patient.  > 50% of time was spent in direct patient care.    Medication Adjustments/Labs and Tests Ordered: Current medicines are reviewed at length with the patient today.  Concerns regarding medicines are outlined above.   Tests Ordered: Orders Placed This Encounter  Procedures   Ambulatory referral to Endocrinology   EKG 12-Lead   ECHOCARDIOGRAM COMPLETE   VAS US RENAL ARTERY DUPLEX     Medication Changes: Meds ordered this encounter  Medications   aspirin EC 81 MG tablet    Sig: Take 1 tablet (81 mg total) by mouth daily. Swallow whole.    Dispense:  30 tablet    Refill:  12    02/06/2023-New   lisinopril (ZESTRIL) 10 MG tablet    Sig: Take 1 tablet (10 mg  total) by mouth daily.    Dispense:  90 tablet    Refill:  1    02/06/2023-Change in Therapy   ezetimibe (ZETIA) 10 MG tablet    Sig: Take 1 tablet (10 mg total) by mouth daily.    Dispense:  90 tablet    Refill:  1    02/06/2023-New     Disposition:  Follow up  6 months  Signed Liannah Yarbough Verne Spurr, MD, 02/06/2023 4:50 PM    [facility] at [facility] [address] [place], [place], [place] [address]

## 2023-02-07 NOTE — Addendum Note (Signed)
Addended by: Carmelina Paddock on: 02/07/2023 10:33 AM   Modules accepted: Orders

## 2023-02-07 NOTE — Telephone Encounter (Signed)
Called patient to let her know that her know that additional test have been ordered. Explained that she will be called to have the Renal Artery scheduled as well as the Referral fpr Endocrinology. Advised her that additional prescription will also be added to the medications we went over yesterday. She also wanted to have lipids checked in 6 months will call her back to remind her. Patient verbalized understanding.

## 2023-02-07 NOTE — Addendum Note (Signed)
Addended by: Carmelina Paddock on: 02/07/2023 09:00 AM   Modules accepted: Orders

## 2023-02-07 NOTE — Addendum Note (Signed)
Addended by: Carmelina Paddock on: 02/07/2023 09:45 AM   Modules accepted: Orders

## 2023-02-11 ENCOUNTER — Telehealth: Payer: Self-pay | Admitting: Internal Medicine

## 2023-02-11 ENCOUNTER — Ambulatory Visit: Payer: Medicare Other

## 2023-02-11 DIAGNOSIS — I2510 Atherosclerotic heart disease of native coronary artery without angina pectoris: Secondary | ICD-10-CM | POA: Diagnosis not present

## 2023-02-11 NOTE — Telephone Encounter (Signed)
Pt returning nurses phone call. Please advise ?

## 2023-02-12 NOTE — Telephone Encounter (Signed)
Left message for patient to call back-returning call from yesterday

## 2023-02-14 ENCOUNTER — Telehealth: Payer: Self-pay

## 2023-02-14 DIAGNOSIS — J449 Chronic obstructive pulmonary disease, unspecified: Secondary | ICD-10-CM

## 2023-02-14 MED ORDER — FUROSEMIDE 20 MG PO TABS: 20.0000 mg | ORAL_TABLET | Freq: Every day | ORAL | 2 refills | Status: DC | PRN

## 2023-02-14 NOTE — Telephone Encounter (Signed)
Spoke with patient see other telephone note.

## 2023-02-14 NOTE — Telephone Encounter (Signed)
 Patient informed and sent PCP copy

## 2023-02-14 NOTE — Telephone Encounter (Signed)
-----   Message from Vishnu P Mallipeddi sent at 02/14/2023  8:17 AM EDT ----- Normal pumping function of the heart, indeterminate diastology, RV moderately enlarged with severe elevation of pulmonary artery pressures and RV pressure/volume overload and CVP is 3 mmHg. Patient has stable SOB and no worsening per my office note. Start p.o. Lasix 20 mg as needed for SOB/LE swelling and obtain PFTs (as there was documentation of severe COPD on the chart).

## 2023-02-24 ENCOUNTER — Telehealth: Payer: Self-pay | Admitting: Internal Medicine

## 2023-02-24 DIAGNOSIS — E785 Hyperlipidemia, unspecified: Secondary | ICD-10-CM

## 2023-02-24 NOTE — Telephone Encounter (Signed)
Spoke with patient regarding medication reaction. Taken Zetia 10 mg daily and noticed 3-5 days afterwards there were 2 places on on the side of her neck that where really big and at times was hard to breathe. She has no stopped taken the medication as of 08/16 and no those places have went down, but can feel them there. Please advise.

## 2023-02-24 NOTE — Telephone Encounter (Signed)
Pt would like to know what her pulmonary function test is for or what will it entail. Please advise.

## 2023-02-24 NOTE — Telephone Encounter (Signed)
Pt c/o medication issue:  1. Name of Medication:    2. How are you currently taking this medication (dosage and times per day)?    3. Are you having a reaction (difficulty breathing--STAT)? no  4. What is your medication issue? Patient states the new medication. Has made her neck swollen, so she has stop taking it. Please advise

## 2023-02-24 NOTE — Telephone Encounter (Signed)
Educated patient on her PFT test, date and location. Patient verbalized understanding

## 2023-02-26 MED ORDER — BEMPEDOIC ACID 180 MG PO TABS: 1.0000 | ORAL_TABLET | Freq: Every day | ORAL | 1 refills | Status: DC

## 2023-02-26 NOTE — Telephone Encounter (Signed)
Per Dr. Jenene Slicker: Molli Knock to stop Zetia. Obtain lipid panel. Start Bempedoic acid 180 mg once daily if patient agreeable (due to statin and zetia intolerance).   Called patient advised her of new medication and to stop Zetia. Placed lab orders. Patient verbalized understanding.

## 2023-02-27 ENCOUNTER — Telehealth: Payer: Self-pay | Admitting: Cardiology

## 2023-02-27 NOTE — Telephone Encounter (Signed)
Pt calling back per phone note on 8/19 stating the medication is too expensive and needs to have something a little cheaper. Please advise

## 2023-02-27 NOTE — Telephone Encounter (Signed)
Will fwd to pharmD for suggestions.

## 2023-02-28 ENCOUNTER — Other Ambulatory Visit: Payer: Self-pay

## 2023-02-28 ENCOUNTER — Telehealth: Payer: Self-pay | Admitting: Cardiology

## 2023-02-28 MED ORDER — ROSUVASTATIN CALCIUM 5 MG PO TABS: 5.0000 mg | ORAL_TABLET | Freq: Every day | ORAL | 3 refills | Status: AC

## 2023-02-28 NOTE — Telephone Encounter (Signed)
Patient informed and verbalized understanding of plan. Agrees to starting rosuvastatin

## 2023-02-28 NOTE — Telephone Encounter (Signed)
Don't have current options to lower cost of Nexletol unfortunately. She has Medicare insurance so cannot use copay card. [employer] currently closed for assistance. Can add her to our list to enroll in grant when it reopens, although we don't know when this will be. It looks like she has tried Livalo 2mg  daily, pravastatin 20mg  daily, and simvastatin 20mg  daily in the past. Could try low dose of rosuvastatin 5mg  daily to see if she tolerates it better if she's willing, would be much cheaper and rosuvastatin is typically one of the better tolerated statins.

## 2023-02-28 NOTE — Telephone Encounter (Signed)
Pt would like a callback regarding one of her test results. Pt states she did not understand them. Please advise

## 2023-02-28 NOTE — Telephone Encounter (Signed)
Spoke with patient a clarified all patient questions patient verbalized understanding

## 2023-03-05 ENCOUNTER — Other Ambulatory Visit (HOSPITAL_BASED_OUTPATIENT_CLINIC_OR_DEPARTMENT_OTHER): Payer: Self-pay

## 2023-03-05 ENCOUNTER — Ambulatory Visit (INDEPENDENT_AMBULATORY_CARE_PROVIDER_SITE_OTHER): Payer: Medicare Other

## 2023-03-05 ENCOUNTER — Other Ambulatory Visit (HOSPITAL_BASED_OUTPATIENT_CLINIC_OR_DEPARTMENT_OTHER): Payer: Self-pay | Admitting: *Deleted

## 2023-03-05 DIAGNOSIS — E785 Hyperlipidemia, unspecified: Secondary | ICD-10-CM

## 2023-03-05 DIAGNOSIS — I701 Atherosclerosis of renal artery: Secondary | ICD-10-CM

## 2023-03-06 LAB — LIPID PANEL
Chol/HDL Ratio: 2.4 ratio (ref 0.0–4.4)
Cholesterol, Total: 225 mg/dL — ABNORMAL HIGH (ref 100–199)
HDL: 95 mg/dL (ref >39)
LDL Chol Calc (NIH): 121 mg/dL — ABNORMAL HIGH (ref 0–99)
Triglycerides: 54 mg/dL (ref 0–149)
VLDL Cholesterol Cal: 9 mg/dL (ref 5–40)

## 2023-03-11 ENCOUNTER — Telehealth: Payer: Self-pay

## 2023-03-11 NOTE — Telephone Encounter (Signed)
Called patient to inform her about cholesterol levels being elevated. Verbalized understanding. She informed me that she was taking Rosuvastatin 5 mg that was added on 08/23/204. Stated that she has been taking for 3 days now. She called prior to let us know that the Zetia she was experiencing side affects, so she is not taking. Will get labs drawn before follow up to see how cholesterol levels are.

## 2023-03-11 NOTE — Telephone Encounter (Signed)
-----   Message from [name] sent at 03/11/2023  1:13 PM EDT ----- Stable left renal artery stenosis

## 2023-03-11 NOTE — Telephone Encounter (Signed)
-----   Message from Vishnu P Mallipeddi sent at 03/11/2023  1:16 PM EDT ----- LDL 121. On Zetia 10 mg once daily. Goal < 70. Not at goal. Will give a few more months. Obtain lipid panel prior to next clinic visit. Will discuss PCSK9i in the next office visit.

## 2023-03-11 NOTE — Telephone Encounter (Signed)
Patient informed and verbalized understanding of plan. 

## 2023-04-03 ENCOUNTER — Telehealth: Payer: Self-pay | Admitting: Internal Medicine

## 2023-04-03 NOTE — Telephone Encounter (Signed)
Pt c/o medication issue:  1. Name of Medication: lisinopril (ZESTRIL) 10 MG tablet   2. How are you currently taking this medication (dosage and times per day)?   Take 1 tablet (10 mg total) by mouth daily.    3. Are you having a reaction (difficulty breathing--STAT)? No  4. What is your medication issue? Pt states she is having problems with this medication like peeing too much and making her dizzy. Please advise

## 2023-04-03 NOTE — Telephone Encounter (Signed)
Reports that she thinks her blood pressure pills are making her sick and weak. Reports she thinks the lisinopril is causing her weakness and sickness Reports she continues to have dizziness although she has been off metoprolol and she thinks her thyroid problem is causing her dizziness. Reports she will see an endocrinologist on 04/16/2023. Reports she does not check her blood pressures at home because she doesn't have a BP cuff. Reports she weighs herself at home sometimes and weights are stable Reports that she has to void very frequently throughout the day, at least 16 times. Reports drinks plenty of fluids and stays well hydrated. Reports she has not taken lasix because it makes her void too much. Reports she has a little swelling in her feet and they feel tight. Reports the swelling improves when she rest. Medications reviewed. Reports she would like to change her lisinopril back to lisinopril/hydrochlorothiazide because she didn't have to void as much. Advised this combination would more than likely make her void more. Denies chest pain and says she has SOB all the time that is unchanged. Encouraged to get a BP cuff to monitor BP's at home, continue staying well hydrated, and encouraged to weigh daily. Advised message would be sent to provider for recommendations. Verbalized understanding.

## 2023-04-04 MED ORDER — LISINOPRIL-HYDROCHLOROTHIAZIDE 10-12.5 MG PO TABS: 1.0000 | ORAL_TABLET | Freq: Every day | ORAL | 2 refills | Status: AC

## 2023-04-04 NOTE — Telephone Encounter (Signed)
Patient informed and verbalized understanding of plan. Reports her PCP sent a prescription to her pharmacy for a BP monitor and her insurance would not cover it. Reports her PCP is assisting her in getting a home BP monitor.

## 2023-04-07 ENCOUNTER — Telehealth: Payer: Self-pay | Admitting: Internal Medicine

## 2023-04-07 NOTE — Telephone Encounter (Signed)
Pt c/o medication issue:  1. Name of Medication:  Patient states she does not know the name of the medication.  2. How are you currently taking this medication (dosage and times per day)?   3. Are you having a reaction (difficulty breathing--STAT)?   4. What is your medication issue?   Patient states she is having issues with her thyroid medication. It has been decreased from 88 MG to 25 MG and it has caused a lot of issues. She states she is "tore all to pieces" and can hardly breathe. She would like to know if Dr. Jenene Slicker agrees with her stopping it prior to seeing the endocrinologist on 10/09.

## 2023-04-08 NOTE — Telephone Encounter (Signed)
Patient verbalized understanding that we do not manage this medication for her I advised her to contact who prescribed it for her or either her new endocrinologist

## 2023-04-09 LAB — TSH: TSH: 100 — AB (ref 0.41–5.90)

## 2023-04-14 ENCOUNTER — Telehealth: Payer: Self-pay | Admitting: Internal Medicine

## 2023-04-14 NOTE — Telephone Encounter (Signed)
Spoke with pt who will call the office of Ronny Bacon, NP for instructions on how to take Levothyroxine.

## 2023-04-14 NOTE — Telephone Encounter (Signed)
Pt c/o medication issue:  1. Name of Medication: levothyroxine (SYNTHROID) 25 MCG tablet   2. How are you currently taking this medication (dosage and times per day)?    3. Are you having a reaction (difficulty breathing--STAT)? no  4. What is your medication issue? Patient calling with questions concerning this medication. Please advise    Patient as if you dont get her on home phone please give her a cell phone call.

## 2023-04-14 NOTE — Patient Instructions (Signed)

## 2023-04-16 ENCOUNTER — Ambulatory Visit (INDEPENDENT_AMBULATORY_CARE_PROVIDER_SITE_OTHER): Payer: Medicare Other | Admitting: Nurse Practitioner

## 2023-04-16 ENCOUNTER — Encounter: Payer: Self-pay | Admitting: Nurse Practitioner

## 2023-04-16 VITALS — BP 146/78 | HR 64 | Ht 59.0 in | Wt 92.2 lb

## 2023-04-16 DIAGNOSIS — E039 Hypothyroidism, unspecified: Secondary | ICD-10-CM | POA: Diagnosis not present

## 2023-04-16 MED ORDER — LEVOTHYROXINE SODIUM 50 MCG PO TABS: 50.0000 ug | ORAL_TABLET | Freq: Every day | ORAL | 0 refills | Status: DC

## 2023-04-16 NOTE — Progress Notes (Signed)
Endocrinology Consult Note                                         04/16/2023, 2:45 PM  Subjective:   Subjective    [name] is a 89 y.o.-year-old female patient being seen in consultation for hypothyroidism referred by Jarvis Morgan, DO.   Past Medical History:  Diagnosis Date   COPD (chronic obstructive pulmonary disease) (HCC)    COPD, severe    Question history of asthma   Coronary artery disease    NSTEMI/high grade left main disease   Essential hypertension    Hypothyroidism    Mixed hyperlipidemia    Peripheral vascular disease (HCC)    50% left RAS   RBBB (right bundle branch block)    Status post coronary artery bypass grafting 2008    Past Surgical History:  Procedure Laterality Date   CATARACT EXTRACTION Bilateral    CHOLECYSTECTOMY  2016   CORONARY ARTERY BYPASS GRAFT  3/08   Three-vessel: LIMA-LAD; SCG-OM1; SVG-RCA; normal LV function   EYE SURGERY Left    detatched retina    Social History   Socioeconomic History   Marital status: Divorced    Spouse name: Not on file   Number of children: Not on file   Years of education: Not on file   Highest education level: Not on file  Occupational History   Not on file  Tobacco Use   Smoking status: Former    Current packs/day: 0.00    Average packs/day: 0.8 packs/day for 55.0 years (44.0 ttl pk-yrs)    Types: Cigarettes    Start date: 07/09/1949    Quit date: 07/08/2006    Years since quitting: 16.7   Smokeless tobacco: Never  Vaping Use   Vaping status: Never Used  Substance and Sexual Activity   Alcohol use: No   Drug use: No   Sexual activity: Not Currently    Birth control/protection: None  Other Topics Concern   Not on file  Social History Narrative   Not on file   Social Determinants of Health   Financial Resource Strain: Low Risk  (04/10/2023)   Received [facility]   Overall Financial Resource Strain  (CARDIA)    Difficulty of Paying Living Expenses: Not hard at all  Food Insecurity: No Food Insecurity (04/10/2023)   Received from [facility]   Hunger Vital Sign    Worried About Running Out of Food in the Last Year: Never true    Ran Out of Food in the Last Year: Never true  Transportation Needs: No Transportation Needs (04/10/2023)   Received from [facility] - Transportation    Lack of Transportation (Medical): No    Lack of Transportation (Non-Medical): No  Physical Activity: Inactive (01/21/2019)   Received from [facility], [facility]   Exercise Vital Sign    Days of Exercise per Week: 0 days    Minutes of Exercise per Session:  0 min  Stress: No Stress Concern Present (01/21/2019)   Received from [facility], [facility]   [facility] - Occupational Stress Questionnaire    Feeling of Stress : Not at all  Social Connections: Moderately Isolated (01/21/2019)   Received from [facility], [facility]   Social Connection and Isolation Panel [NHANES]    Frequency of Communication with Friends and Family: More than three times a week    Frequency of Social Gatherings with Friends and Family: Never    Attends Religious Services: More than 4 times per year    Active Member of [employer] or Organizations: No    Attends Banker Meetings: Never    Marital Status: Widowed    Family History  Problem Relation Age of Onset   Colon cancer Neg Hx     Outpatient Encounter Medications as of 04/16/2023  Medication Sig   aspirin EC 81 MG tablet Take 1 tablet (81 mg total) by mouth daily. Swallow whole.   Carboxymethylcellulose Sodium (THERATEARS) 0.25 % SOLN Place 1 drop into the right eye at bedtime.   fluticasone furoate-vilanterol (BREO ELLIPTA) 100-25 MCG/ACT AEPB Inhale 1 puff into the lungs daily.   ipratropium-albuterol (DUONEB) 0.5-2.5 (3) MG/3ML SOLN USE ONE vial in nebulizer EVERY 6 HOURS AS NEEDED  SHORTNESS OF BREATH (Patient taking differently: Take 3 mLs by nebulization every 6 (six) hours as needed (for shortness of breath or wheezing).)   levothyroxine (SYNTHROID) 50 MCG tablet Take 1 tablet (50 mcg total) by mouth daily before breakfast.   lisinopril-hydrochlorothiazide (ZESTORETIC) 10-12.5 MG tablet Take 1 tablet by mouth daily.   metoprolol succinate (TOPROL-XL) 50 MG 24 hr tablet 50 mg daily.   nitroGLYCERIN (NITROSTAT) 0.4 MG SL tablet Place 1 tablet (0.4 mg total) under the tongue every 5 (five) minutes x 3 doses as needed for chest pain (for severe chest pain. If no relief after 3rd dose, proceed to the ED for an evaluation).   rosuvastatin (CRESTOR) 5 MG tablet Take 1 tablet (5 mg total) by mouth daily.   VENTOLIN HFA 108 (90 Base) MCG/ACT inhaler INHALE 2 PUFFS INTO THE LUNGS EVERY 4 (FOUR) HOURS AS NEEDED FOR WHEEZING OR SHORTNESS OF BREATH. (Patient taking differently: Inhale 2 puffs into the lungs every 4 (four) hours as needed for wheezing or shortness of breath.)   [DISCONTINUED] levothyroxine (SYNTHROID) 75 MCG tablet Take 75 mcg by mouth daily before breakfast.   ezetimibe (ZETIA) 10 MG tablet Take 1 tablet (10 mg total) by mouth daily. (Patient not taking: Reported on 04/16/2023)   [DISCONTINUED] furosemide (LASIX) 20 MG tablet Take 1 tablet (20 mg total) by mouth daily as needed. SOB and Leg swelling   [DISCONTINUED] levothyroxine (SYNTHROID) 25 MCG tablet Take 25 mcg by mouth daily before breakfast. (Patient not taking: Reported on 04/16/2023)   [DISCONTINUED] lisinopril (ZESTRIL) 10 MG tablet Take 1 tablet (10 mg total) by mouth daily.   No facility-administered encounter medications on file as of 04/16/2023.    ALLERGIES: Allergies  Allergen Reactions   Prednisone Other (See Comments)    Unknown   Ciprofloxacin Rash   Codeine Swelling and Rash   VACCINATION STATUS:  There is no immunization history on file for this patient.   HPI   [name]  is a  patient with the above medical history. she was diagnosed with hypothyroidism at approximate age of 42 years, which required subsequent initiation of thyroid hormone replacement therapy. she was  given various doses of Levothyroxine over the years, currently prescribed 75 micrograms after recent hospitalization.  She reports she is not tolerating the medication, that it is causing a sensation like bugs crawling on her scalp and visual changes.  She notes her thyroid was well managed up until about 6 months ago or so when her insurance changed.  Says her thyroid medication may have been changed to a different brand at that time.  She notes she has been taking her thyroid medication properly, spacing it out from all other medications.  She notes she sometimes skips her heart medication as she is not sure if those may be causing her symptoms too.   I reviewed patient's thyroid tests:  Lab Results  Component Value Date   TSH 100.00 (A) 04/09/2023   TSH 100.00 (A) 12/30/2022   TSH 46.61 (H) 05/15/2018   TSH 1.960 12/24/2016   TSH 8.240 (H) 10/22/2016   TSH 0.097 (L) 11/30/2015   TSH 0.805 09/06/2013     Pt denies any over symptoms of hypo or hyperactive thyroid.  Pt denies feeling nodules in neck, hoarseness, dysphagia/odynophagia, SOB with lying down.  She is SOB both at rest and with exertion but has history of COPD.  she denies any known family history of thyroid disorders, no family history of thyroid cancer.  No history of radiation therapy to head or neck.  No recent use of iodine supplements.  Denies use of Biotin containing supplements.  I reviewed her chart and she also has a history of CAD with MI, PVD.   ROS:  Constitutional: no weight gain/loss, no fatigue, no subjective hyperthermia, no subjective hypothermia, intermittent sensation of bugs crawling on her head (says when thyroid dosage was increased) Eyes: + blurry vision (worse recently), no xerophthalmia, left eye has hx of  retinal damage ENT: no sore throat, no nodules palpated in throat, no dysphagia/odynophagia, no hoarseness Cardiovascular: no chest pain, no SOB, no palpitations, no leg swelling Respiratory: no cough, no SOB Gastrointestinal: no nausea/vomiting/diarrhea, + colostomy Musculoskeletal: no muscle/joint aches Skin: no rashes Neurological: no tremors, no numbness, no tingling, no dizziness Psychiatric: no depression, no anxiety   Objective:   Objective     BP (!) 146/78 (BP Location: Right Arm, Patient Position: Sitting, Cuff Size: Large) Comment: Recheck Manuel Cuff - Patient states that she has not taken her medications today, she shares that her PCP is aware, Raphel Stickles made aware.  Pulse 64   Ht 4\' 11"  (1.499 m)   Wt 92 lb 3.2 oz (41.8 kg)   BMI 18.62 kg/m  Wt Readings from Last 3 Encounters:  04/16/23 92 lb 3.2 oz (41.8 kg)  02/06/23 89 lb 3.2 oz (40.5 kg)  07/30/18 88 lb (39.9 kg)    BP Readings from Last 3 Encounters:  04/16/23 (!) 146/78  02/06/23 132/70  07/30/18 (!) 113/56     Constitutional:  Body mass index is 18.62 kg/m., not in acute distress, normal state of mind Eyes: PERRLA, EOMI, no exophthalmos ENT: moist mucous membranes, no thyromegaly, no cervical lymphadenopathy Cardiovascular: normal precordial activity, RRR, no murmur/rubs/gallops Respiratory:  adequate breathing efforts, no gross chest deformity, Clear to auscultation bilaterally (diminished in bases) Gastrointestinal: abdomen soft, non-tender, no distension, bowel sounds present Musculoskeletal: no gross deformities, strength intact in all four extremities Skin: moist, warm, no rashes, bruises scattered to BUE Neurological: + slight tremor with outstretched hands, deep tendon reflexes normal in BLE.   CMP ( most recent) CMP     Component Value  Date/Time   NA 141 07/30/2018 1016   NA 141 04/14/2018 1208   K 3.4 (L) 07/30/2018 1016   CL 109 07/30/2018 1016   CO2 21 (L) 07/30/2018 1016   GLUCOSE  96 07/30/2018 1016   BUN 35 (H) 07/30/2018 1016   BUN 14 04/14/2018 1208   CREATININE 1.12 (H) 07/30/2018 1016   CREATININE 0.95 09/06/2013 1007   CALCIUM 8.7 (L) 07/30/2018 1016   PROT 6.1 (L) 04/14/2018 1404   PROT 6.6 03/24/2017 1235   ALBUMIN 3.5 04/14/2018 1404   ALBUMIN 4.1 03/24/2017 1235   AST 21 04/14/2018 1404   ALT 17 04/14/2018 1404   ALKPHOS 60 04/14/2018 1404   BILITOT 0.7 04/14/2018 1404   BILITOT 0.3 03/24/2017 1235   GFRNONAA 45 (L) 07/30/2018 1016     Diabetic Labs (most recent): No results found for: "HGBA1C", "MICROALBUR"   Lipid Panel ( most recent) Lipid Panel     Component Value Date/Time   CHOL 225 (H) 03/05/2023 1134   TRIG 54 03/05/2023 1134   HDL 95 03/05/2023 1134   CHOLHDL 2.4 03/05/2023 1134   CHOLHDL 3.3 09/06/2013 1007   VLDL 13 09/06/2013 1007   LDLCALC 121 (H) 03/05/2023 1134   LDLDIRECT 139 (H) 10/22/2016 1423   LABVLDL 9 03/05/2023 1134       Lab Results  Component Value Date   TSH 100.00 (A) 04/09/2023   TSH 100.00 (A) 12/30/2022   TSH 46.61 (H) 05/15/2018   TSH 1.960 12/24/2016   TSH 8.240 (H) 10/22/2016   TSH 0.097 (L) 11/30/2015   TSH 0.805 09/06/2013      Assessment & Plan:   ASSESSMENT / PLAN:  1. Hypothyroidism-unspecified  Patient with long-standing hypothyroidism, not currently on levothyroxine therapy. On physical exam , patient does not have gross goiter, thyroid nodules, or neck compression symptoms.  Based on body weight, maximum replacement dose should be around 66 mcg.  Her prescribed dosages have been higher recently (has been on 75 mcg and 88 mcg recently).  This over-replacement could very well be contributing to her recent symptoms.  For safety purposes, we did lower her Levothyroxine to 50 mcg po daily before breakfast.  She is instructed to take it daily as recommended (to call the office if she experiences any unpleasant side effects so we can discuss the plan moving forward).  - We discussed about  correct intake of levothyroxine, at fasting, with water, separated by at least 30 minutes from breakfast, and separated by more than 4 hours from calcium, iron, multivitamins, acid reflux medications (PPIs). -Patient is made aware of the fact that thyroid hormone replacement is needed for life, dose to be adjusted by periodic monitoring of thyroid function tests.  - Will check thyroid tests before next visit: TSH, free T4, and antibody testing to help classify her dysfunction.  -Due to absence of clinical goiter, no need for thyroid ultrasound.   - Time spent with the patient: 45 minutes, of which >50% was spent in obtaining information about her symptoms, reviewing her previous labs, evaluations, and treatments, counseling her about her hypothyroidism, and developing a plan to confirm the diagnosis and long term treatment as necessary. Please refer to "Patient Self Inventory" in the Media tab for reviewed elements of pertinent patient history.  Lucile Crater participated in the discussions, expressed understanding, and voiced agreement with the above plans.  All questions were answered to her satisfaction. she is encouraged to contact clinic should she have any questions or concerns  prior to her return visit.   FOLLOW UP PLAN:  Return in about 7 weeks (around 06/04/2023) for Thyroid follow up, Previsit labs.  Ronny Bacon, [facility] [address] [place], [place] [address] Phone: [phone] Fax: [phone]  04/16/2023, 2:45 PM

## 2023-04-22 ENCOUNTER — Ambulatory Visit (HOSPITAL_COMMUNITY): Admission: RE | Admit: 2023-04-22 | Payer: Medicare Other | Source: Ambulatory Visit

## 2023-05-02 ENCOUNTER — Other Ambulatory Visit: Payer: Self-pay | Admitting: Nurse Practitioner

## 2023-05-13 ENCOUNTER — Other Ambulatory Visit: Payer: Self-pay | Admitting: Internal Medicine

## 2023-06-04 ENCOUNTER — Ambulatory Visit: Payer: Medicare Other | Admitting: Nurse Practitioner

## 2023-06-04 DIAGNOSIS — E039 Hypothyroidism, unspecified: Secondary | ICD-10-CM

## 2023-07-09 DEATH — deceased

## 2023-08-02 ENCOUNTER — Other Ambulatory Visit: Payer: Self-pay | Admitting: Internal Medicine

## 2023-08-12 ENCOUNTER — Encounter: Payer: Self-pay | Admitting: Internal Medicine

## 2023-08-12 ENCOUNTER — Ambulatory Visit: Payer: Self-pay | Attending: Internal Medicine | Admitting: Internal Medicine

## 2023-08-12 NOTE — Progress Notes (Signed)
 Erroneous encounter - please disregard.
